# Patient Record
Sex: Male | Born: 1965 | Race: White | Hispanic: No | Marital: Single | Smoking: Never smoker
Health system: Southern US, Community
[De-identification: ages and names within clinical notes are randomized; demographics above are authoritative.]

## PROBLEM LIST (undated history)

## (undated) DIAGNOSIS — T7840XA Allergy, unspecified, initial encounter: Secondary | ICD-10-CM

## (undated) DIAGNOSIS — F329 Major depressive disorder, single episode, unspecified: Secondary | ICD-10-CM

## (undated) DIAGNOSIS — F419 Anxiety disorder, unspecified: Secondary | ICD-10-CM

## (undated) DIAGNOSIS — E785 Hyperlipidemia, unspecified: Secondary | ICD-10-CM

## (undated) DIAGNOSIS — F32A Depression, unspecified: Secondary | ICD-10-CM

## (undated) HISTORY — DX: Hyperlipidemia, unspecified: E78.5

## (undated) HISTORY — DX: Depression, unspecified: F32.A

## (undated) HISTORY — PX: COLONOSCOPY: SHX174

## (undated) HISTORY — DX: Major depressive disorder, single episode, unspecified: F32.9

## (undated) HISTORY — PX: SKIN BIOPSY: SHX1

## (undated) HISTORY — DX: Anxiety disorder, unspecified: F41.9

## (undated) HISTORY — DX: Allergy, unspecified, initial encounter: T78.40XA

---

## 1984-10-23 HISTORY — PX: DERMABRASION: SHX610

## 2004-10-27 ENCOUNTER — Ambulatory Visit: Payer: Self-pay | Admitting: Family Medicine

## 2004-12-14 ENCOUNTER — Ambulatory Visit: Payer: Self-pay | Admitting: Family Medicine

## 2005-02-21 ENCOUNTER — Ambulatory Visit: Payer: Self-pay | Admitting: Family Medicine

## 2005-09-18 ENCOUNTER — Ambulatory Visit: Payer: Self-pay | Admitting: Family Medicine

## 2005-10-10 ENCOUNTER — Ambulatory Visit: Payer: Self-pay | Admitting: Family Medicine

## 2011-03-30 ENCOUNTER — Ambulatory Visit (INDEPENDENT_AMBULATORY_CARE_PROVIDER_SITE_OTHER): Payer: BC Managed Care – PPO | Admitting: Family Medicine

## 2011-03-30 ENCOUNTER — Encounter: Payer: Self-pay | Admitting: Family Medicine

## 2011-03-30 VITALS — BP 108/80 | HR 64 | Ht 68.0 in | Wt 171.0 lb

## 2011-03-30 DIAGNOSIS — R5381 Other malaise: Secondary | ICD-10-CM

## 2011-03-30 DIAGNOSIS — R5383 Other fatigue: Secondary | ICD-10-CM

## 2011-03-30 DIAGNOSIS — F419 Anxiety disorder, unspecified: Secondary | ICD-10-CM

## 2011-03-30 DIAGNOSIS — F32A Depression, unspecified: Secondary | ICD-10-CM | POA: Insufficient documentation

## 2011-03-30 DIAGNOSIS — F329 Major depressive disorder, single episode, unspecified: Secondary | ICD-10-CM | POA: Insufficient documentation

## 2011-03-30 DIAGNOSIS — N400 Enlarged prostate without lower urinary tract symptoms: Secondary | ICD-10-CM

## 2011-03-30 DIAGNOSIS — F411 Generalized anxiety disorder: Secondary | ICD-10-CM

## 2011-03-30 DIAGNOSIS — Z8249 Family history of ischemic heart disease and other diseases of the circulatory system: Secondary | ICD-10-CM

## 2011-03-30 DIAGNOSIS — Z0000 Encounter for general adult medical examination without abnormal findings: Secondary | ICD-10-CM

## 2011-03-30 LAB — COMPREHENSIVE METABOLIC PANEL
ALT: 16 U/L (ref 0–53)
AST: 18 U/L (ref 0–37)
Albumin: 4.8 g/dL (ref 3.5–5.2)
Alkaline Phosphatase: 69 U/L (ref 39–117)
Glucose, Bld: 89 mg/dL (ref 70–99)
Potassium: 4.2 mEq/L (ref 3.5–5.3)
Sodium: 141 mEq/L (ref 135–145)
Total Protein: 7.4 g/dL (ref 6.0–8.3)

## 2011-03-30 LAB — CBC WITH DIFFERENTIAL/PLATELET
Basophils Relative: 0 % (ref 0–1)
Hemoglobin: 15 g/dL (ref 13.0–17.0)
MCHC: 34.4 g/dL (ref 30.0–36.0)
Monocytes Relative: 6 % (ref 3–12)
Neutro Abs: 4.3 10*3/uL (ref 1.7–7.7)
Neutrophils Relative %: 66 % (ref 43–77)
Platelets: 224 10*3/uL (ref 150–400)
RBC: 5.21 MIL/uL (ref 4.22–5.81)

## 2011-03-30 LAB — POCT URINALYSIS DIPSTICK
Bilirubin, UA: NEGATIVE
Glucose, UA: NEGATIVE
Leukocytes, UA: NEGATIVE
Nitrite, UA: NEGATIVE
pH, UA: 6

## 2011-03-30 LAB — LIPID PANEL
LDL Cholesterol: 117 mg/dL — ABNORMAL HIGH (ref 0–99)
Triglycerides: 292 mg/dL — ABNORMAL HIGH (ref <150)
VLDL: 58 mg/dL — ABNORMAL HIGH (ref 0–40)

## 2011-03-30 LAB — TESTOSTERONE: Testosterone: 659.77 ng/dL (ref 250–890)

## 2011-03-30 LAB — TSH: TSH: 0.929 u[IU]/mL (ref 0.350–4.500)

## 2011-03-30 MED ORDER — FINASTERIDE 5 MG PO TABS: 5.0000 mg | ORAL_TABLET | Freq: Every day | ORAL | Status: DC

## 2011-03-30 MED ORDER — DUTASTERIDE 0.5 MG PO CAPS: 0.5000 mg | ORAL_CAPSULE | Freq: Every day | ORAL | Status: AC

## 2011-03-30 MED ORDER — MIRTAZAPINE 30 MG PO TABS: 30.0000 mg | ORAL_TABLET | Freq: Two times a day (BID) | ORAL | Status: DC

## 2011-03-30 MED ORDER — TAMSULOSIN HCL 0.4 MG PO CAPS: 0.4000 mg | ORAL_CAPSULE | Freq: Two times a day (BID) | ORAL | Status: DC

## 2011-03-30 NOTE — Progress Notes (Signed)
Subjective:    Patient ID: Daniel Cherry, male    DOB: Aug 21, 1966, 45 y.o.   MRN: [id number]  HPI he is here for a complete examination. He has a previous history of BPH and is on multiple medications for this. He had been seeing a physician in the [place] area. He continues to have difficulty with urinary urgency, frequency, decreased stream he also has a history of anxiety and presently is on Remeron. He was placed on this by Dr. Phillip Heal and he will followup with her. He does complain of difficulty the last 3 months with fatigue as well as decreased libido. No skin or hair changes. No history of depression. His social and family history were reviewed and does show a history of heart disease with his sister. He is in a monogamous 7 year relationship. His work is going well. He exercises regularly. He does wear his seatbelt.    Review of Systems  Constitutional: Positive for fatigue.  HENT: Negative.   Eyes: Negative.   Respiratory: Negative.   Cardiovascular: Negative.   Gastrointestinal: Negative.   Genitourinary: Positive for urgency, frequency and difficulty urinating.  Musculoskeletal: Negative.   Skin: Negative.   Neurological: Negative.   Hematological: Negative.   Psychiatric/Behavioral: Negative.        Objective:   Physical ExamBP 108/80  Pulse 64  Ht 5\' 8"  (1.727 m)  Wt 171 lb (77.565 kg)  BMI 26.00 kg/m2  General Appearance:    Alert, cooperative, no distress, appears stated age  Head:    Normocephalic, without obvious abnormality, atraumatic  Eyes:    PERRL, conjunctiva/corneas clear, EOM's intact, fundi    benign  Ears:    Normal TM's and external ear canals  Nose:   Nares normal, mucosa normal, no drainage or sinus   tenderness  Throat:   Lips, mucosa, and tongue normal; teeth and gums normal  Neck:   Supple, no lymphadenopathy;  thyroid:  no   enlargement/tenderness/nodules; no carotid   bruit or JVD  Back:    Spine nontender, no curvature, ROM normal, no  CVA     tenderness  Lungs:     Clear to auscultation bilaterally without wheezes, rales or     ronchi; respirations unlabored  Chest Wall:    No tenderness or deformity   Heart:    Regular rate and rhythm, S1 and S2 normal, no murmur, rub   or gallop  Breast Exam:    No chest wall tenderness, masses or gynecomastia  Abdomen:     Soft, non-tender, nondistended, normoactive bowel sounds,    no masses, no hepatosplenomegaly  Genitalia:    Normal male external genitalia without lesions.  Testicles without masses.  No inguinal hernias.      Extremities:   No clubbing, cyanosis or edema  Pulses:   2+ and symmetric all extremities  Skin:   Skin color, texture, turgor normal, no rashes or lesions.skin on the face does have scarring from previous acne surgery   Lymph nodes:   Cervical, supraclavicular, and axillary nodes normal  Neurologic:   CNII-XII intact, normal strength, sensation and gait; reflexes 2+ and symmetric throughout          Psych:   Normal mood, affect, hygiene and grooming.    EKG shows no acute changes        Assessment & Plan:  BPH. Anxiety. Fatigue. I will refer back to urology. He will followup with his psychiatrist concerning his anxiety. Routine blood work including thyroid  and testosterone was done as well as PSA.

## 2011-03-30 NOTE — Patient Instructions (Signed)
I will refer you to urology. Followup with her psychiatrist. We will call you with results of the blood work.

## 2011-04-28 ENCOUNTER — Telehealth: Payer: Self-pay | Admitting: Family Medicine

## 2011-04-28 MED ORDER — FINASTERIDE 5 MG PO TABS: 5.0000 mg | ORAL_TABLET | Freq: Every day | ORAL | Status: DC

## 2011-04-28 NOTE — Telephone Encounter (Signed)
LOOKS LIKE DR.LALONDE HAS TAKEN CARE OF IT

## 2011-04-28 NOTE — Telephone Encounter (Signed)
Is this ok?

## 2011-05-19 ENCOUNTER — Encounter: Payer: Self-pay | Admitting: Family Medicine

## 2011-05-19 ENCOUNTER — Ambulatory Visit (INDEPENDENT_AMBULATORY_CARE_PROVIDER_SITE_OTHER): Payer: BC Managed Care – PPO | Admitting: Family Medicine

## 2011-05-19 VITALS — BP 110/80 | HR 88 | Temp 98.1°F | Ht 68.0 in | Wt 173.0 lb

## 2011-05-19 DIAGNOSIS — J111 Influenza due to unidentified influenza virus with other respiratory manifestations: Secondary | ICD-10-CM

## 2011-05-19 MED ORDER — AMOXICILLIN 875 MG PO TABS: 875.0000 mg | ORAL_TABLET | Freq: Two times a day (BID) | ORAL | Status: AC

## 2011-05-19 NOTE — Progress Notes (Signed)
  Subjective:    Patient ID: Daniel Cherry, male    DOB: [birth year], 45 y.o.   MRN: [id number]  HPI Approximately one week ago he had the onset of chest congestion, headache, myalgias. No sore throat or earache. Yesterday he developed fever as well as productive cough cough, sinus pressure and ear congestion as well as fatigue and malaise. He does not smoke.   Review of Systems     Objective:   Physical Exam alert and in no distress. Tympanic membranes and canals are normal. Throat is clear. Tonsils are normal. Neck is supple without adenopathy or thyromegaly. Cardiac exam shows a regular sinus rhythm without murmurs or gallops. Lungs are clear to auscultation.        Assessment & Plan:  Bronchitis I will treat with Amoxil

## 2011-05-19 NOTE — Patient Instructions (Signed)
Call the antibiotic and if not totally back to normal call me

## 2011-07-17 ENCOUNTER — Telehealth: Payer: Self-pay | Admitting: Family Medicine

## 2011-07-18 ENCOUNTER — Other Ambulatory Visit: Payer: Self-pay

## 2011-07-18 MED ORDER — TAMSULOSIN HCL 0.4 MG PO CAPS: 0.4000 mg | ORAL_CAPSULE | Freq: Two times a day (BID) | ORAL | Status: DC

## 2011-07-18 NOTE — Telephone Encounter (Signed)
Consent [facility] sent to [facility]

## 2011-07-18 NOTE — Telephone Encounter (Signed)
Med was sent in

## 2011-07-18 NOTE — Telephone Encounter (Signed)
Is this ok?

## 2012-04-10 ENCOUNTER — Other Ambulatory Visit: Payer: Self-pay | Admitting: Family Medicine

## 2012-04-19 ENCOUNTER — Encounter: Payer: BC Managed Care – PPO | Admitting: Family Medicine

## 2012-07-11 ENCOUNTER — Encounter: Payer: Self-pay | Admitting: Family Medicine

## 2012-07-11 ENCOUNTER — Other Ambulatory Visit: Payer: Self-pay

## 2012-07-11 ENCOUNTER — Ambulatory Visit (INDEPENDENT_AMBULATORY_CARE_PROVIDER_SITE_OTHER): Payer: BC Managed Care – PPO | Admitting: Family Medicine

## 2012-07-11 VITALS — BP 110/70 | HR 80

## 2012-07-11 DIAGNOSIS — F329 Major depressive disorder, single episode, unspecified: Secondary | ICD-10-CM

## 2012-07-11 DIAGNOSIS — Z23 Encounter for immunization: Secondary | ICD-10-CM

## 2012-07-11 DIAGNOSIS — Z0000 Encounter for general adult medical examination without abnormal findings: Secondary | ICD-10-CM

## 2012-07-11 DIAGNOSIS — L255 Unspecified contact dermatitis due to plants, except food: Secondary | ICD-10-CM

## 2012-07-11 DIAGNOSIS — F32A Depression, unspecified: Secondary | ICD-10-CM

## 2012-07-11 DIAGNOSIS — F3289 Other specified depressive episodes: Secondary | ICD-10-CM

## 2012-07-11 LAB — LIPID PANEL
Cholesterol: 208 mg/dL — ABNORMAL HIGH (ref 0–200)
LDL Cholesterol: 113 mg/dL — ABNORMAL HIGH (ref 0–99)
Triglycerides: 295 mg/dL — ABNORMAL HIGH (ref <150)

## 2012-07-11 LAB — POCT URINALYSIS DIPSTICK
Bilirubin, UA: NEGATIVE
Glucose, UA: NEGATIVE
Ketones, UA: NEGATIVE
Leukocytes, UA: NEGATIVE
Nitrite, UA: NEGATIVE
pH, UA: 7

## 2012-07-11 LAB — COMPREHENSIVE METABOLIC PANEL
Albumin: 5 g/dL (ref 3.5–5.2)
Alkaline Phosphatase: 67 U/L (ref 39–117)
BUN: 16 mg/dL (ref 6–23)
CO2: 26 mEq/L (ref 19–32)
Calcium: 9.8 mg/dL (ref 8.4–10.5)
Chloride: 104 mEq/L (ref 96–112)
Glucose, Bld: 92 mg/dL (ref 70–99)
Potassium: 4.2 mEq/L (ref 3.5–5.3)
Sodium: 139 mEq/L (ref 135–145)
Total Protein: 7.4 g/dL (ref 6.0–8.3)

## 2012-07-11 LAB — CBC WITH DIFFERENTIAL/PLATELET
HCT: 42 % (ref 39.0–52.0)
Hemoglobin: 14.8 g/dL (ref 13.0–17.0)
Lymphocytes Relative: 22 % (ref 12–46)
Lymphs Abs: 1.7 10*3/uL (ref 0.7–4.0)
Monocytes Absolute: 0.5 10*3/uL (ref 0.1–1.0)
Monocytes Relative: 6 % (ref 3–12)
Neutro Abs: 5.3 10*3/uL (ref 1.7–7.7)
Neutrophils Relative %: 69 % (ref 43–77)
RBC: 5.16 MIL/uL (ref 4.22–5.81)

## 2012-07-11 LAB — HEMOCCULT GUIAC POC 1CARD (OFFICE)

## 2012-07-11 MED ORDER — FINASTERIDE 5 MG PO TABS: 5.0000 mg | ORAL_TABLET | Freq: Every day | ORAL | Status: DC

## 2012-07-11 MED ORDER — MIRTAZAPINE 30 MG PO TABS: 30.0000 mg | ORAL_TABLET | Freq: Two times a day (BID) | ORAL | Status: DC

## 2012-07-11 MED ORDER — TAMSULOSIN HCL 0.4 MG PO CAPS: 0.4000 mg | ORAL_CAPSULE | Freq: Two times a day (BID) | ORAL | Status: DC

## 2012-07-11 NOTE — Telephone Encounter (Signed)
Sent med in for pt  

## 2012-07-11 NOTE — Progress Notes (Signed)
Subjective:    Patient ID: Daniel Cherry, male    DOB: [birth year], 46 y.o.   MRN: [id number]  HPI He is here for complete examination. He is on Remeron and Rozerem given to him by Dr. Madaline Guthrie and is doing well on this. He has a long history of anxiety and did have a recent bout of depression. He is also on Flomax given to him by a urologist in [place]. He apparently did have an extensive workup and is doing well on his present medication. When he does not take the medicine regularly, he does have a great deal of difficulty urinating. He also has had recent exposure to poison ivy and is slowly recovering. Lesions are mainly on his legs. Family history was reviewed. He exercises regularly, does wear his seatbelt. He is in an 8 year monogamous relationship and this is going quite well.  Review of Systems  Constitutional: Negative.   HENT: Negative.   Respiratory: Negative.   Cardiovascular: Negative.   Gastrointestinal: Negative.   Genitourinary: Negative.   Musculoskeletal: Negative.   Skin: Positive for rash.  Neurological: Negative.   Hematological: Negative.   Psychiatric/Behavioral: Negative.        Objective:   Physical Exam BP 110/70  Pulse 80  SpO2 99%  General Appearance:    Alert, cooperative, no distress, appears stated age  Head:    Normocephalic, without obvious abnormality, atraumatic  Eyes:    PERRL, conjunctiva/corneas clear, EOM's intact, fundi    benign  Ears:    Normal TM's and external ear canals  Nose:   Nares normal, mucosa normal, no drainage or sinus   tenderness  Throat:   Lips, mucosa, and tongue normal; teeth and gums normal  Neck:   Supple, no lymphadenopathy;  thyroid:  no   enlargement/tenderness/nodules; no carotid   bruit or JVD  Back:    Spine nontender, no curvature, ROM normal, no CVA     tenderness  Lungs:     Clear to auscultation bilaterally without wheezes, rales or     ronchi; respirations unlabored  Chest Wall:    No tenderness or deformity   Heart:    Regular rate and rhythm, S1 and S2 normal, no murmur, rub   or gallop  Breast Exam:    No chest wall tenderness, masses or gynecomastia  Abdomen:     Soft, non-tender, nondistended, normoactive bowel sounds,    no masses, no hepatosplenomegaly  Genitalia:    Normal male external genitalia without lesions.  Testicles without masses.  No inguinal hernias.  Rectal:    Normal sphincter tone, no masses or tenderness; guaiac negative stool.  Prostate smooth, no nodules, not enlarged.  Extremities:   No clubbing, cyanosis or edema  Pulses:   2+ and symmetric all extremities  Skin:   Skin color, texture, turgor normal, no rashes or lesions  Lymph nodes:   Cervical, supraclavicular, and axillary nodes normal  Neurologic:   CNII-XII intact, normal strength, sensation and gait; reflexes 2+ and symmetric throughout          Psych:   Normal mood, affect, hygiene and grooming.          Assessment & Plan:   1. Routine general medical examination at a health care facility  POCT Urinalysis Dipstick, CBC with Differential, Comprehensive metabolic panel, Lipid panel, Hemoccult - 1 Card (office)  2. Rhus dermatitis    3. Depression     I encouraged him to continue to take excellent care of  himself. Flu shot offered and he refused.

## 2012-07-11 NOTE — Patient Instructions (Signed)
Keep taking good care of yourself 

## 2012-07-12 ENCOUNTER — Other Ambulatory Visit: Payer: Self-pay | Admitting: Internal Medicine

## 2012-07-16 ENCOUNTER — Telehealth: Payer: Self-pay | Admitting: Family Medicine

## 2012-07-16 NOTE — Telephone Encounter (Signed)
Verify the dosing with him.He gets this from his psychiatrist

## 2012-07-16 NOTE — Telephone Encounter (Signed)
Called pt spoke with Daniel Cherry he states phsychiatrist has him on 30 mg 2 at bedtime.  [facility] and [facility] same.

## 2012-07-16 NOTE — Telephone Encounter (Signed)
[facility] called and wants to verify strength for Remeron 30 mg 1 by mouth two times daily.  He is called due to high dose.  Please advise [facility] [phone] ref [id number]

## 2012-09-23 ENCOUNTER — Encounter: Payer: Self-pay | Admitting: Family Medicine

## 2012-11-08 ENCOUNTER — Telehealth: Payer: Self-pay | Admitting: Family Medicine

## 2012-11-08 MED ORDER — AZITHROMYCIN 250 MG PO TABS: ORAL_TABLET | ORAL | Status: DC

## 2012-11-08 NOTE — Telephone Encounter (Signed)
A Z-Pak was called in for pertussis prevention.

## 2012-12-07 ENCOUNTER — Other Ambulatory Visit: Payer: Self-pay

## 2013-01-20 ENCOUNTER — Other Ambulatory Visit: Payer: Self-pay | Admitting: Family Medicine

## 2013-02-06 ENCOUNTER — Telehealth: Payer: Self-pay | Admitting: Internal Medicine

## 2013-02-06 MED ORDER — TAMSULOSIN HCL 0.4 MG PO CAPS: 0.4000 mg | ORAL_CAPSULE | Freq: Every day | ORAL | Status: DC

## 2013-02-06 MED ORDER — FINASTERIDE 5 MG PO TABS: 5.0000 mg | ORAL_TABLET | Freq: Every day | ORAL | Status: DC

## 2013-02-06 MED ORDER — TAMSULOSIN HCL 0.4 MG PO CAPS: ORAL_CAPSULE | ORAL | Status: DC

## 2013-02-06 NOTE — Telephone Encounter (Signed)
Pt is on vacation in [place] and left meds at home and wanted [facility] [address] and needs a 2 week supply of meds

## 2013-03-13 ENCOUNTER — Other Ambulatory Visit: Payer: Self-pay | Admitting: Family Medicine

## 2013-03-13 NOTE — Telephone Encounter (Signed)
IS THIS OK AND IS THE AMOUNT CORRECT

## 2013-06-16 ENCOUNTER — Encounter: Payer: Self-pay | Admitting: Neurology

## 2013-06-18 ENCOUNTER — Ambulatory Visit: Payer: BC Managed Care – PPO | Admitting: Neurology

## 2013-08-05 ENCOUNTER — Ambulatory Visit (INDEPENDENT_AMBULATORY_CARE_PROVIDER_SITE_OTHER): Payer: BC Managed Care – PPO | Admitting: Family Medicine

## 2013-08-05 ENCOUNTER — Encounter: Payer: Self-pay | Admitting: Family Medicine

## 2013-08-05 VITALS — BP 106/68 | HR 80 | Wt 167.0 lb

## 2013-08-05 DIAGNOSIS — F419 Anxiety disorder, unspecified: Secondary | ICD-10-CM

## 2013-08-05 DIAGNOSIS — N400 Enlarged prostate without lower urinary tract symptoms: Secondary | ICD-10-CM

## 2013-08-05 DIAGNOSIS — Z8249 Family history of ischemic heart disease and other diseases of the circulatory system: Secondary | ICD-10-CM

## 2013-08-05 DIAGNOSIS — F411 Generalized anxiety disorder: Secondary | ICD-10-CM

## 2013-08-05 LAB — LIPID PANEL
Cholesterol: 150 mg/dL (ref 0–200)
HDL: 38 mg/dL — ABNORMAL LOW (ref >39)
Total CHOL/HDL Ratio: 3.9 Ratio

## 2013-08-05 MED ORDER — FINASTERIDE 5 MG PO TABS: ORAL_TABLET | ORAL | Status: DC

## 2013-08-05 MED ORDER — TAMSULOSIN HCL 0.4 MG PO CAPS: ORAL_CAPSULE | ORAL | Status: DC

## 2013-08-05 NOTE — Progress Notes (Signed)
  Subjective:    Patient ID: Daniel Cherry, male    DOB: [birth year], 47 y.o.   MRN: [id number]  HPI He is here for medication check. He continues to be followed by Dr. Madaline Cherry for treatment of his underlying anxiety. He seems to be doing well and is on a new medication. He has been in counseling in the past. There is a family history of anxiety. He also is had difficulty with BPH and recently saw urology. He seems to be relatively stable on his present medication regimen. He is also being sent to physical therapy to help with exercises. His home life is fairly stable although his her and her partner are now living with him temporarily. He has no other concerns or complaints. Review of his history indicates positive family history for heart disease   Review of Systems     Objective:   Physical Exam Daniel Cherry and in no distress otherwise not examined       Assessment & Plan:  BPH (benign prostatic hyperplasia) - Plan: tamsulosin (FLOMAX) 0.4 MG CAPS capsule, finasteride (PROSCAR) 5 MG tablet  Family history of heart disease in male family member before age 59 - Plan: Lipid panel  Anxiety  flu shot offered however he refused. I will continue him on his present medication regimen.

## 2013-08-28 ENCOUNTER — Other Ambulatory Visit: Payer: Self-pay

## 2013-10-27 ENCOUNTER — Telehealth: Payer: Self-pay | Admitting: Family Medicine

## 2013-10-27 DIAGNOSIS — N400 Enlarged prostate without lower urinary tract symptoms: Secondary | ICD-10-CM

## 2013-10-27 MED ORDER — TAMSULOSIN HCL 0.4 MG PO CAPS: ORAL_CAPSULE | ORAL | Status: DC

## 2013-10-27 NOTE — Telephone Encounter (Signed)
Flomax called in.

## 2014-08-07 ENCOUNTER — Other Ambulatory Visit: Payer: Self-pay

## 2014-08-10 ENCOUNTER — Encounter: Payer: Self-pay | Admitting: Family Medicine

## 2014-08-10 ENCOUNTER — Ambulatory Visit (INDEPENDENT_AMBULATORY_CARE_PROVIDER_SITE_OTHER): Payer: BC Managed Care – PPO | Admitting: Family Medicine

## 2014-08-10 VITALS — BP 110/78 | HR 70 | Wt 178.0 lb

## 2014-08-10 DIAGNOSIS — N400 Enlarged prostate without lower urinary tract symptoms: Secondary | ICD-10-CM

## 2014-08-10 DIAGNOSIS — F329 Major depressive disorder, single episode, unspecified: Secondary | ICD-10-CM

## 2014-08-10 DIAGNOSIS — F419 Anxiety disorder, unspecified: Secondary | ICD-10-CM

## 2014-08-10 DIAGNOSIS — F32A Depression, unspecified: Secondary | ICD-10-CM

## 2014-08-10 DIAGNOSIS — E781 Pure hyperglyceridemia: Secondary | ICD-10-CM

## 2014-08-10 DIAGNOSIS — Z8249 Family history of ischemic heart disease and other diseases of the circulatory system: Secondary | ICD-10-CM

## 2014-08-10 DIAGNOSIS — E785 Hyperlipidemia, unspecified: Secondary | ICD-10-CM

## 2014-08-10 LAB — CBC WITH DIFFERENTIAL/PLATELET
Basophils Absolute: 0 10*3/uL (ref 0.0–0.1)
Basophils Relative: 0 % (ref 0–1)
EOS ABS: 0.2 10*3/uL (ref 0.0–0.7)
EOS PCT: 2 % (ref 0–5)
HCT: 40.2 % (ref 39.0–52.0)
HEMOGLOBIN: 14.4 g/dL (ref 13.0–17.0)
LYMPHS ABS: 1.9 10*3/uL (ref 0.7–4.0)
Lymphocytes Relative: 21 % (ref 12–46)
MCH: 29.1 pg (ref 26.0–34.0)
MCHC: 35.8 g/dL (ref 30.0–36.0)
MCV: 81.4 fL (ref 78.0–100.0)
MONOS PCT: 7 % (ref 3–12)
Monocytes Absolute: 0.6 10*3/uL (ref 0.1–1.0)
Neutro Abs: 6.3 10*3/uL (ref 1.7–7.7)
Neutrophils Relative %: 70 % (ref 43–77)
Platelets: 249 10*3/uL (ref 150–400)
RBC: 4.94 MIL/uL (ref 4.22–5.81)
RDW: 13.2 % (ref 11.5–15.5)
WBC: 9 10*3/uL (ref 4.0–10.5)

## 2014-08-10 LAB — COMPREHENSIVE METABOLIC PANEL
ALT: 18 U/L (ref 0–53)
AST: 17 U/L (ref 0–37)
Albumin: 4.7 g/dL (ref 3.5–5.2)
Alkaline Phosphatase: 71 U/L (ref 39–117)
BILIRUBIN TOTAL: 0.4 mg/dL (ref 0.2–1.2)
BUN: 12 mg/dL (ref 6–23)
CALCIUM: 9.5 mg/dL (ref 8.4–10.5)
CO2: 24 meq/L (ref 19–32)
CREATININE: 0.86 mg/dL (ref 0.50–1.35)
Chloride: 104 mEq/L (ref 96–112)
Glucose, Bld: 80 mg/dL (ref 70–99)
Potassium: 4.2 mEq/L (ref 3.5–5.3)
Sodium: 139 mEq/L (ref 135–145)
Total Protein: 6.9 g/dL (ref 6.0–8.3)

## 2014-08-10 LAB — LIPID PANEL
Cholesterol: 225 mg/dL — ABNORMAL HIGH (ref 0–200)
HDL: 35 mg/dL — ABNORMAL LOW (ref >39)
TRIGLYCERIDES: 641 mg/dL — AB (ref <150)
Total CHOL/HDL Ratio: 6.4 Ratio

## 2014-08-10 MED ORDER — FINASTERIDE 5 MG PO TABS: ORAL_TABLET | ORAL | Status: DC

## 2014-08-10 MED ORDER — TAMSULOSIN HCL 0.4 MG PO CAPS: ORAL_CAPSULE | ORAL | Status: DC

## 2014-08-10 NOTE — Progress Notes (Signed)
   Subjective:    Patient ID: Daniel Cherry, male    DOB: 04/08/1966, 48 y.o.   MRN: [id number]  HPI He is here for medication check. He continues to be followed by Dr. Albertine Patricia for his underlying anxiety and depression and at this point seems to be very stable. He has been on the present drug regimen for over a year. He does have a history of BPH with OAB. He has seen urology in the past for this. He is stable on the Flomax and Proscar. Does have a family history of heart disease however is not on any medications. Smoking and drinking were reviewed. His relationship is going quite well. No plans at this time to get married.   Review of Systems     Objective:   Physical Exam alert and in no distress. Tympanic membranes and canals are normal. Throat is clear. Tonsils are normal. Neck is supple without adenopathy or thyromegaly. Cardiac exam shows a regular sinus rhythm without murmurs or gallops. Lungs are clear to auscultation.        Assessment & Plan:  Family history of heart disease in male family member before age 48 - Plan: CBC with Differential, Comprehensive metabolic panel, Lipid panel  Anxiety  BPH (benign prostatic hyperplasia) - Plan: tamsulosin (FLOMAX) 0.4 MG CAPS capsule, finasteride (PROSCAR) 5 MG tablet, CBC with Differential, Comprehensive metabolic panel  Depression  I encouraged him to continue to take good care of himself.

## 2014-08-11 DIAGNOSIS — E782 Mixed hyperlipidemia: Secondary | ICD-10-CM | POA: Insufficient documentation

## 2014-08-11 DIAGNOSIS — E785 Hyperlipidemia, unspecified: Secondary | ICD-10-CM | POA: Insufficient documentation

## 2014-08-11 DIAGNOSIS — E781 Pure hyperglyceridemia: Secondary | ICD-10-CM | POA: Insufficient documentation

## 2014-08-11 MED ORDER — ATORVASTATIN CALCIUM 20 MG PO TABS: 20.0000 mg | ORAL_TABLET | Freq: Every day | ORAL | Status: DC

## 2014-08-11 NOTE — Addendum Note (Signed)
Addended by: Denita Lung on: 08/11/2014 10:25 AM   Modules accepted: Orders

## 2014-10-17 ENCOUNTER — Ambulatory Visit (INDEPENDENT_AMBULATORY_CARE_PROVIDER_SITE_OTHER): Payer: BC Managed Care – PPO

## 2014-10-17 ENCOUNTER — Ambulatory Visit (INDEPENDENT_AMBULATORY_CARE_PROVIDER_SITE_OTHER): Payer: BC Managed Care – PPO | Admitting: Emergency Medicine

## 2014-10-17 VITALS — BP 120/82 | HR 93 | Temp 98.0°F | Resp 16 | Ht 68.4 in | Wt 181.8 lb

## 2014-10-17 DIAGNOSIS — R509 Fever, unspecified: Secondary | ICD-10-CM

## 2014-10-17 DIAGNOSIS — R05 Cough: Secondary | ICD-10-CM

## 2014-10-17 DIAGNOSIS — R0981 Nasal congestion: Secondary | ICD-10-CM

## 2014-10-17 DIAGNOSIS — R059 Cough, unspecified: Secondary | ICD-10-CM

## 2014-10-17 DIAGNOSIS — J189 Pneumonia, unspecified organism: Secondary | ICD-10-CM

## 2014-10-17 LAB — POCT CBC
Granulocyte percent: 70.4 %G (ref 37–80)
HCT, POC: 44.2 % (ref 43.5–53.7)
HEMOGLOBIN: 14.6 g/dL (ref 14.1–18.1)
LYMPH, POC: 1.6 (ref 0.6–3.4)
MCH: 28.4 pg (ref 27–31.2)
MCHC: 33.1 g/dL (ref 31.8–35.4)
MCV: 85.8 fL (ref 80–97)
MID (cbc): 0.9 (ref 0–0.9)
MPV: 7.1 fL (ref 0–99.8)
POC Granulocyte: 5.9 (ref 2–6.9)
POC LYMPH PERCENT: 18.8 %L (ref 10–50)
POC MID %: 10.8 %M (ref 0–12)
Platelet Count, POC: 212 10*3/uL (ref 142–424)
RBC: 5.15 M/uL (ref 4.69–6.13)
RDW, POC: 12.9 %
WBC: 8.4 10*3/uL (ref 4.6–10.2)

## 2014-10-17 LAB — HIV ANTIBODY (ROUTINE TESTING W REFLEX): HIV 1&2 Ab, 4th Generation: NONREACTIVE

## 2014-10-17 MED ORDER — HYDROCODONE-HOMATROPINE 5-1.5 MG/5ML PO SYRP: 5.0000 mL | ORAL_SOLUTION | Freq: Three times a day (TID) | ORAL | Status: DC | PRN

## 2014-10-17 MED ORDER — BENZONATATE 100 MG PO CAPS
100.0000 mg | ORAL_CAPSULE | Freq: Three times a day (TID) | ORAL | Status: DC | PRN
Start: 2014-10-17 — End: 2015-07-19

## 2014-10-17 MED ORDER — DOXYCYCLINE HYCLATE 100 MG PO TABS: 100.0000 mg | ORAL_TABLET | Freq: Two times a day (BID) | ORAL | Status: DC

## 2014-10-17 NOTE — Patient Instructions (Signed)

## 2014-10-17 NOTE — Progress Notes (Addendum)
   Subjective:    Patient ID: Daniel Cherry, male    DOB: 01-22-1966, 48 y.o.   MRN: [id number] This chart was scribed for Arlyss Queen, MD by Zola Button, Medical Scribe. This patient was seen in room 4 and the patient's care was started at 12:49 PM.   HPI HPI Comments: Daniel Cherry is a 48 y.o. male who presents to the [facility] complaining of gradual onset URI symptoms that started about 11 days ago. The symptoms improved after about 7-8 days since onset of symptoms, but they have started to worsen again. Patient reports having chest congestion, sinus congestion, dry cough, ears popping, fever, and difficulty sleeping the past 2 nights due to symptoms. He measured his temperature this morning at 104 degrees F. He has tried Mucinex, Nyquil and Claritin D without relief. Patient denies wheezing. He also denies smoking. Patient lives with his sister and two kids; they have also been sick with a barking cough that is worse than the patient's cough.   Review of Systems  Constitutional: Positive for fever.  HENT: Positive for congestion and ear pain.   Respiratory: Positive for cough. Negative for wheezing.   Psychiatric/Behavioral: Positive for sleep disturbance.       Objective:   Physical Exam CONSTITUTIONAL: Well developed/well nourished HEAD: Normocephalic/atraumatic EYES: EOM/PERRL ENMT: Mucous membranes moist NECK: supple no meningeal signs SPINE: entire spine nontender CV: S1/S2 noted, no murmurs/rubs/gallops noted LUNGS: Lungs are clear to auscultation bilaterally, no apparent distress ABDOMEN: soft, nontender, no rebound or guarding GU: no cva tenderness NEURO: Pt is awake/alert, moves all extremitiesx4 EXTREMITIES: pulses normal, full ROM SKIN: warm, color normal PSYCH: no abnormalities of mood noted [facility] reading (PRIMARY) by  Dr. Everlene Farrier there is an infiltrate in the right base with a small amount of fluid with blunting of the right costophrenic angle no  consolidated area. Results for orders placed or performed in visit on 10/17/14  POCT CBC  Result Value Ref Range   WBC 8.4 4.6 - 10.2 K/uL   Lymph, poc 1.6 0.6 - 3.4   POC LYMPH PERCENT 18.8 10 - 50 %L   MID (cbc) 0.9 0 - 0.9   POC MID % 10.8 0 - 12 %M   POC Granulocyte 5.9 2 - 6.9   Granulocyte percent 70.4 37 - 80 %G   RBC 5.15 4.69 - 6.13 M/uL   Hemoglobin 14.6 14.1 - 18.1 g/dL   HCT, POC 44.2 43.5 - 53.7 %   MCV 85.8 80 - 97 fL   MCH, POC 28.4 27 - 31.2 pg   MCHC 33.1 31.8 - 35.4 g/dL   RDW, POC 12.9 %   Platelet Count, POC 212 142 - 424 K/uL   MPV 7.1 0 - 99.8 fL          Assessment & Plan:  Patient has a right lower lobe infiltrate with small amount of fluid at right costophrenic angle. Will treat with doxycycline , and Tessalon Pearls. Follow up chest x-ray in ten days. Hycodan at HS. I personally performed the services described in this documentation, which was scribed in my presence. The recorded information has been reviewed and is accurate.

## 2015-03-05 ENCOUNTER — Ambulatory Visit (INDEPENDENT_AMBULATORY_CARE_PROVIDER_SITE_OTHER): Payer: Managed Care, Other (non HMO) | Admitting: Medical

## 2015-03-05 VITALS — BP 118/70 | HR 82 | Temp 97.5°F | Wt 177.6 lb

## 2015-03-05 DIAGNOSIS — N419 Inflammatory disease of prostate, unspecified: Secondary | ICD-10-CM | POA: Diagnosis not present

## 2015-03-05 DIAGNOSIS — R350 Frequency of micturition: Secondary | ICD-10-CM

## 2015-03-05 DIAGNOSIS — B351 Tinea unguium: Secondary | ICD-10-CM

## 2015-03-05 DIAGNOSIS — N400 Enlarged prostate without lower urinary tract symptoms: Secondary | ICD-10-CM

## 2015-03-05 DIAGNOSIS — R300 Dysuria: Secondary | ICD-10-CM | POA: Diagnosis not present

## 2015-03-05 LAB — POCT URINALYSIS DIPSTICK
Bilirubin, UA: NEGATIVE
Blood, UA: NEGATIVE
GLUCOSE UA: NEGATIVE
Ketones, UA: NEGATIVE
LEUKOCYTES UA: NEGATIVE
NITRITE UA: NEGATIVE
PH UA: 6
PROTEIN UA: NEGATIVE
Spec Grav, UA: 1.015
UROBILINOGEN UA: NEGATIVE

## 2015-03-05 MED ORDER — EFINACONAZOLE 10 % EX SOLN: CUTANEOUS | Status: DC

## 2015-03-05 MED ORDER — TAMSULOSIN HCL 0.4 MG PO CAPS: ORAL_CAPSULE | ORAL | Status: DC

## 2015-03-05 MED ORDER — CIPROFLOXACIN HCL 500 MG PO TABS: 500.0000 mg | ORAL_TABLET | Freq: Two times a day (BID) | ORAL | Status: DC

## 2015-03-05 MED ORDER — FINASTERIDE 5 MG PO TABS: ORAL_TABLET | ORAL | Status: DC

## 2015-03-05 NOTE — Progress Notes (Signed)
Subjective: Here for possible urinary tract infection x 2 weeks, worse in the last week.  He notes urinary frequency, urgency, but no urinary pressure, burning or pain.   No blood in urine, no discharge, no redness, no rash.  No back pain, no fever.  No bowel changes, no rectal pressure.  He has had UTI in the past, but has had prostate infections in the past. Last prostate 2-3 years ago.  No prior hospitalization for same.  Water intake in general is very good.  Has 1 partner.     No concern for STD, declines STD testing  Needs refills on BPH medication given job and insurance changes.  Does fine on these  Has toenail fungus for months not resolving with OTC meds  No other aggravating or relieving factors. No other complaint.  Objective: BP 118/70 mmHg  Pulse 82  Temp(Src) 97.5 F (36.4 C) (Oral)  Wt 177 lb 9.6 oz (80.559 kg)  Gen: wd, wn, nad Skin: yellow somewhat thickened nails of great toenails and few other nails Back nontender Abdomen +bs, soft, nontender, no mass, no organomegaly GU: normal male, circumcised, nontender, no mass, no lymphadenopathy, no hernia Rectal declined  Assessment: Encounter Diagnoses  Name Primary?  . Dysuria Yes  . Urinary frequency   . Prostatitis, unspecified prostatitis type   . BPH (benign prostatic hyperplasia)   . Onychomycosis    Plan: Dysuria, urinary frequency, prostatitis - begin Cipro x 2wk then recheck, may need to go full month of therapy, hydrate well, NSAID OTC BPH - c/t current medications, f/u with Dr. Redmond School on this onychomycosis - begin trial of Jublia, discussed timeframe for treatment results.  F/u 2wk on prostatitis

## 2015-06-04 ENCOUNTER — Other Ambulatory Visit: Payer: Self-pay | Admitting: Medical

## 2015-06-07 NOTE — Telephone Encounter (Signed)
See if this medicine is working and let me know

## 2015-06-07 NOTE — Telephone Encounter (Signed)
Is this okay to refill? 

## 2015-06-08 NOTE — Telephone Encounter (Signed)
Pt said it appears to be working

## 2015-06-19 ENCOUNTER — Telehealth: Payer: Self-pay | Admitting: Medical

## 2015-06-22 NOTE — Telephone Encounter (Signed)
Shane completed questionnaire & form emailed

## 2015-06-23 NOTE — Telephone Encounter (Signed)
We did not get a culture.  So if they want to c/t this instead of oral Lamisil, then have them return for toenail culture or other test.

## 2015-06-23 NOTE — Telephone Encounter (Signed)
P.A. Denied due to pt needs to have a diagnosis confirmed by one of the following tests: positive potassium hydroxide (KOH) preparation, culture or histology.

## 2015-06-24 NOTE — Telephone Encounter (Signed)
The other option is a trial of oral Lamisil.  Does he want to do this?

## 2015-06-24 NOTE — Telephone Encounter (Signed)
No, he called back and is going to come in for nail culture next Thursday.

## 2015-06-24 NOTE — Telephone Encounter (Signed)
Advised patient. He has appointment the end of the month and he has a newer job and can not get off anymore.

## 2015-06-24 NOTE — Telephone Encounter (Signed)
LM to CB

## 2015-07-01 ENCOUNTER — Encounter: Payer: Self-pay | Admitting: Medical

## 2015-07-01 ENCOUNTER — Ambulatory Visit (INDEPENDENT_AMBULATORY_CARE_PROVIDER_SITE_OTHER): Payer: PRIVATE HEALTH INSURANCE | Admitting: Medical

## 2015-07-01 VITALS — BP 120/70 | HR 68 | Temp 97.9°F | Resp 16 | Wt 180.0 lb

## 2015-07-01 DIAGNOSIS — B351 Tinea unguium: Secondary | ICD-10-CM

## 2015-07-01 NOTE — Progress Notes (Signed)
   Subjective Chief Complaint  Patient presents with  . nail culture    left toe nail fungus   Here for nail fungus.   I have seem him prior for the same.  Been having nail fungus for months.   Did trial of Jublia, but now insurance is requiring toenail culture which is why he is here.  Feels like he is seeing some improvement on Jublia.   Not ready to try oral Lamisil.   No other c/o.  ROS as in subjective  Objective: BP 120/70 mmHg  Pulse 68  Temp(Src) 97.9 F (36.6 C) (Oral)  Resp 16  Wt 180 lb (81.647 kg)  Gen: wd, wn, nad Skin/nails: yellow somewhat thickened nails of great toenails and few other nails   Assessment: Encounter Diagnosis  Name Primary?  Marland Kitchen Onychomycosis Yes    Plan: Toenail sample taken with sterile toenail clippers and sent for culture.   Will await culture and presumably will restart Jublia.  Clinically he has nail fungus.

## 2015-07-19 ENCOUNTER — Other Ambulatory Visit: Payer: Self-pay

## 2015-07-19 ENCOUNTER — Ambulatory Visit (INDEPENDENT_AMBULATORY_CARE_PROVIDER_SITE_OTHER): Payer: PRIVATE HEALTH INSURANCE | Admitting: Family Medicine

## 2015-07-19 VITALS — BP 100/60 | HR 84 | Wt 176.0 lb

## 2015-07-19 DIAGNOSIS — N419 Inflammatory disease of prostate, unspecified: Secondary | ICD-10-CM | POA: Diagnosis not present

## 2015-07-19 DIAGNOSIS — N400 Enlarged prostate without lower urinary tract symptoms: Secondary | ICD-10-CM

## 2015-07-19 DIAGNOSIS — N410 Acute prostatitis: Secondary | ICD-10-CM | POA: Diagnosis not present

## 2015-07-19 DIAGNOSIS — B079 Viral wart, unspecified: Secondary | ICD-10-CM | POA: Diagnosis not present

## 2015-07-19 DIAGNOSIS — Z79899 Other long term (current) drug therapy: Secondary | ICD-10-CM

## 2015-07-19 DIAGNOSIS — B351 Tinea unguium: Secondary | ICD-10-CM

## 2015-07-19 DIAGNOSIS — R350 Frequency of micturition: Secondary | ICD-10-CM

## 2015-07-19 LAB — CBC WITH DIFFERENTIAL/PLATELET
BASOS PCT: 0 % (ref 0–1)
Basophils Absolute: 0 10*3/uL (ref 0.0–0.1)
EOS ABS: 0.2 10*3/uL (ref 0.0–0.7)
Eosinophils Relative: 3 % (ref 0–5)
HCT: 42.3 % (ref 39.0–52.0)
HEMOGLOBIN: 14.4 g/dL (ref 13.0–17.0)
Lymphocytes Relative: 24 % (ref 12–46)
Lymphs Abs: 1.7 10*3/uL (ref 0.7–4.0)
MCH: 28.1 pg (ref 26.0–34.0)
MCHC: 34 g/dL (ref 30.0–36.0)
MCV: 82.6 fL (ref 78.0–100.0)
MONO ABS: 0.6 10*3/uL (ref 0.1–1.0)
MPV: 9.7 fL (ref 8.6–12.4)
Monocytes Relative: 8 % (ref 3–12)
NEUTROS ABS: 4.5 10*3/uL (ref 1.7–7.7)
NEUTROS PCT: 65 % (ref 43–77)
PLATELETS: 239 10*3/uL (ref 150–400)
RBC: 5.12 MIL/uL (ref 4.22–5.81)
RDW: 13.4 % (ref 11.5–15.5)
WBC: 6.9 10*3/uL (ref 4.0–10.5)

## 2015-07-19 LAB — COMPREHENSIVE METABOLIC PANEL
ALBUMIN: 4.5 g/dL (ref 3.6–5.1)
ALT: 18 U/L (ref 9–46)
AST: 19 U/L (ref 10–35)
Alkaline Phosphatase: 69 U/L (ref 40–115)
BILIRUBIN TOTAL: 0.5 mg/dL (ref 0.2–1.2)
BUN: 15 mg/dL (ref 7–25)
CO2: 22 mmol/L (ref 20–31)
CREATININE: 0.82 mg/dL (ref 0.70–1.33)
Calcium: 9.4 mg/dL (ref 8.6–10.3)
Chloride: 105 mmol/L (ref 98–110)
GLUCOSE: 104 mg/dL — AB (ref 65–99)
Potassium: 3.8 mmol/L (ref 3.5–5.3)
SODIUM: 137 mmol/L (ref 135–146)
Total Protein: 6.8 g/dL (ref 6.1–8.1)

## 2015-07-19 LAB — LIPID PANEL
Cholesterol: 207 mg/dL — ABNORMAL HIGH (ref 125–200)
HDL: 34 mg/dL — ABNORMAL LOW (ref >=40)
LDL CALC: 121 mg/dL (ref <130)
TRIGLYCERIDES: 261 mg/dL — AB (ref <150)
Total CHOL/HDL Ratio: 6.1 Ratio — ABNORMAL HIGH (ref <=5.0)
VLDL: 52 mg/dL — AB (ref <30)

## 2015-07-19 MED ORDER — TERBINAFINE HCL 250 MG PO TABS: 250.0000 mg | ORAL_TABLET | Freq: Every day | ORAL | Status: DC

## 2015-07-19 MED ORDER — TAMSULOSIN HCL 0.4 MG PO CAPS: ORAL_CAPSULE | ORAL | Status: DC

## 2015-07-19 MED ORDER — CIPROFLOXACIN HCL 500 MG PO TABS: 500.0000 mg | ORAL_TABLET | Freq: Two times a day (BID) | ORAL | Status: DC

## 2015-07-19 MED ORDER — FINASTERIDE 5 MG PO TABS
ORAL_TABLET | ORAL | Status: DC
Start: 2015-07-19 — End: 2016-07-18

## 2015-07-19 NOTE — Progress Notes (Signed)
   Subjective:    Patient ID: Daniel Cherry, male    DOB: [birth year], 49 y.o.   MRN: [id number]  HPI He is here for multiple issues. He does have underlying BPH and continues on medications given to him by urology. I have been following up with him on this. He has done fairly well until last 2 weeks when he has noted increased frequency, decreased stream but no fever, chills, abdominal pain. In the past when this has occurred he has had a prostate infection which required use of Cipro for at least a month. He also has a lesion on the top of his head that he would like evaluated. He also has a history of onychomycosis however his insurance will not pay for the topical preparation. He continues to be followed by his psychiatrist who wants routine blood work especially since he is on multiple cyclotropic medications. He also states he now has a new job and this is going quite well.   Review of Systems     Objective:   Physical Exam Alert and in no distress with appropriate affect. A 1 cm raised slightly pigmented lesion is noted in the mid parieto-occipital area. Rectal exam does show a very tender and boggy prostate. He does have evidence of thickening of the left great toenail.      Assessment & Plan:  Wart  Onychomycosis - Plan: terbinafine (LAMISIL) 250 MG tablet  Urinary frequency  Prostatitis, unspecified prostatitis type  BPH (benign prostatic hyperplasia) - Plan: tamsulosin (FLOMAX) 0.4 MG CAPS capsule, finasteride (PROSCAR) 5 MG tablet  Encounter for long-term (current) use of medications - Plan: CBC with Differential/Platelet, Comprehensive metabolic panel, Lipid panel  Acute prostatitis - Plan: ciprofloxacin (CIPRO) 500 MG tablet  recommend he return here at his convenience for excision of the wart. Discussed treatment of the onychomycosis and did recommend trying Lamisil. He is comfortable with this. A 90 day supply will be given. He is to call with his prostate if he continues  have difficulty. Flu shot offered however he refused.

## 2015-07-21 NOTE — Telephone Encounter (Signed)
Waiting culture results

## 2015-07-28 ENCOUNTER — Other Ambulatory Visit: Payer: Self-pay | Admitting: Medical

## 2015-07-28 LAB — CULTURE, FUNGUS WITHOUT SMEAR

## 2015-07-28 MED ORDER — CICLOPIROX 8 % EX SOLN: Freq: Every day | CUTANEOUS | Status: DC

## 2015-07-29 ENCOUNTER — Telehealth: Payer: Self-pay

## 2015-07-29 MED ORDER — CICLOPIROX 8 % EX SOLN: CUTANEOUS | Status: DC

## 2015-07-29 NOTE — Telephone Encounter (Signed)
Sent in rx.

## 2015-08-03 ENCOUNTER — Other Ambulatory Visit: Payer: Self-pay | Admitting: Medical

## 2015-08-10 NOTE — Telephone Encounter (Signed)
Pt switched to [place]

## 2015-08-16 ENCOUNTER — Telehealth: Payer: Self-pay | Admitting: Family Medicine

## 2015-08-16 DIAGNOSIS — N410 Acute prostatitis: Secondary | ICD-10-CM

## 2015-08-16 MED ORDER — CIPROFLOXACIN HCL 500 MG PO TABS: 500.0000 mg | ORAL_TABLET | Freq: Two times a day (BID) | ORAL | Status: DC

## 2015-08-16 NOTE — Telephone Encounter (Signed)
Patient called, he wants extension on Rx for prostate infection. States after a week got better but then got worse again but over last few days seeing improvement again. States he has had several of these in the past and feels like it is too soon to stop meds, it would just flare back up    [facility] [employer] [facility] [address]  Please advise patient if rx sent in or not

## 2015-08-16 NOTE — Telephone Encounter (Signed)
Let him know that the med was called in

## 2015-08-16 NOTE — Telephone Encounter (Signed)
Left message word for word  

## 2015-08-29 ENCOUNTER — Other Ambulatory Visit: Payer: Self-pay | Admitting: Medical

## 2015-08-30 NOTE — Telephone Encounter (Signed)
Is this ok to refill?  

## 2015-09-08 ENCOUNTER — Telehealth: Payer: Self-pay | Admitting: Family Medicine

## 2015-09-08 NOTE — Telephone Encounter (Signed)
Pt called and was requesting a refill on his terbinafine, looked and seen that he should have a 3 month supply, called the pharmacy, and they confirmed that had given him 68 tablets and that he had 22 tablets left that he could get,that the insurance just wouldn't pay for them all at one time, called pt and told him that the pharmacy would fill the 22 tablets and he would go get them, pt said he would call back if needed a appt to come back in to be seen for his nail fungus,

## 2015-10-12 ENCOUNTER — Ambulatory Visit (INDEPENDENT_AMBULATORY_CARE_PROVIDER_SITE_OTHER): Payer: PRIVATE HEALTH INSURANCE | Admitting: Family Medicine

## 2015-10-12 ENCOUNTER — Encounter: Payer: Self-pay | Admitting: Family Medicine

## 2015-10-12 VITALS — BP 130/78 | HR 80 | Resp 16 | Wt 185.2 lb

## 2015-10-12 DIAGNOSIS — Z23 Encounter for immunization: Secondary | ICD-10-CM

## 2015-10-12 DIAGNOSIS — Z79899 Other long term (current) drug therapy: Secondary | ICD-10-CM | POA: Diagnosis not present

## 2015-10-12 DIAGNOSIS — B079 Viral wart, unspecified: Secondary | ICD-10-CM

## 2015-10-12 DIAGNOSIS — B351 Tinea unguium: Secondary | ICD-10-CM | POA: Diagnosis not present

## 2015-10-12 LAB — COMPREHENSIVE METABOLIC PANEL
ALBUMIN: 4.8 g/dL (ref 3.6–5.1)
ALK PHOS: 75 U/L (ref 40–115)
ALT: 22 U/L (ref 9–46)
AST: 17 U/L (ref 10–35)
BILIRUBIN TOTAL: 0.3 mg/dL (ref 0.2–1.2)
BUN: 12 mg/dL (ref 7–25)
CHLORIDE: 104 mmol/L (ref 98–110)
CO2: 22 mmol/L (ref 20–31)
CREATININE: 0.87 mg/dL (ref 0.70–1.33)
Calcium: 9.3 mg/dL (ref 8.6–10.3)
Glucose, Bld: 94 mg/dL (ref 65–99)
Potassium: 3.9 mmol/L (ref 3.5–5.3)
SODIUM: 138 mmol/L (ref 135–146)
TOTAL PROTEIN: 7.4 g/dL (ref 6.1–8.1)

## 2015-10-12 MED ORDER — TERBINAFINE HCL 250 MG PO TABS: 250.0000 mg | ORAL_TABLET | Freq: Every day | ORAL | Status: DC

## 2015-10-12 NOTE — Progress Notes (Signed)
   Subjective:    Patient ID: Daniel Cherry, male    DOB: [birth year], 49 y.o.   MRN: [id number]  HPI He is here for a recheck. He does have onychomycosis and has been on Lamisil. He has had no difficulty from this. He also has 2 lesions on his upper anterior chest is well as 1 on his head that he would like evaluated.   Review of Systems     Objective:   Physical Exam A 2 one and half centimeter round raised confluently pigmented lesion is noted on the parieto-occipital area. He also has 2 less than 5 mm lesions on his anterior chest. One does appear to be a wartlike lesion, the other probably a benign mole Exam of his feet does show questionable resolution at the base of his toes.      Assessment & Plan:  Wart  Onychomycosis - Plan: Comprehensive metabolic panel, terbinafine (LAMISIL) 250 MG tablet  Encounter for long-term (current) use of medications - Plan: Comprehensive metabolic panel  Need for prophylactic vaccination and inoculation against influenza - Plan: Flu Vaccine QUAD 36+ mos IM  will continue on the Lamisil for the next several months. The lesions were injected without candidate epinephrine. Shave excisions were done on all 3 and the base is hyfrecated with silver nitrate. There was sent for pathology.

## 2015-11-02 ENCOUNTER — Telehealth: Payer: Self-pay

## 2015-11-02 NOTE — Telephone Encounter (Signed)
Left message for patient to see if he was going to get flu vaccine or declined.

## 2015-12-01 ENCOUNTER — Other Ambulatory Visit: Payer: Self-pay | Admitting: *Deleted

## 2015-12-01 NOTE — Telephone Encounter (Signed)
Called patient  [employer] results: No cancer Dr Redmond School

## 2015-12-07 ENCOUNTER — Telehealth: Payer: Self-pay | Admitting: *Deleted

## 2015-12-07 NOTE — Telephone Encounter (Signed)
[facility] Results No Cancer

## 2016-01-06 ENCOUNTER — Encounter: Payer: Self-pay | Admitting: Family Medicine

## 2016-01-09 ENCOUNTER — Other Ambulatory Visit: Payer: Self-pay | Admitting: Family Medicine

## 2016-01-09 ENCOUNTER — Telehealth: Payer: Self-pay | Admitting: Family Medicine

## 2016-01-09 MED ORDER — OSELTAMIVIR PHOSPHATE 75 MG PO CAPS: 75.0000 mg | ORAL_CAPSULE | Freq: Every day | ORAL | Status: DC

## 2016-01-09 NOTE — Telephone Encounter (Signed)
Family member diagnosed with influenza.  Requesting Tamiflu.  A/p: influenza exposure: tamiflu escribed.

## 2016-01-31 ENCOUNTER — Ambulatory Visit (INDEPENDENT_AMBULATORY_CARE_PROVIDER_SITE_OTHER): Payer: PRIVATE HEALTH INSURANCE | Admitting: Family Medicine

## 2016-01-31 ENCOUNTER — Encounter: Payer: Self-pay | Admitting: Family Medicine

## 2016-01-31 VITALS — BP 102/60 | HR 90 | Ht 68.5 in | Wt 177.8 lb

## 2016-01-31 DIAGNOSIS — D229 Melanocytic nevi, unspecified: Secondary | ICD-10-CM

## 2016-01-31 DIAGNOSIS — B351 Tinea unguium: Secondary | ICD-10-CM | POA: Diagnosis not present

## 2016-01-31 DIAGNOSIS — J209 Acute bronchitis, unspecified: Secondary | ICD-10-CM

## 2016-01-31 DIAGNOSIS — J309 Allergic rhinitis, unspecified: Secondary | ICD-10-CM

## 2016-01-31 MED ORDER — CLARITHROMYCIN 500 MG PO TABS: 500.0000 mg | ORAL_TABLET | Freq: Two times a day (BID) | ORAL | Status: DC

## 2016-01-31 MED ORDER — ITRACONAZOLE 100 MG PO CAPS: ORAL_CAPSULE | ORAL | Status: DC

## 2016-01-31 NOTE — Progress Notes (Signed)
   Subjective:    Patient ID: Daniel Cherry, male    DOB: [birth year], 50 y.o.   MRN: [id number]  HPI He is here for multiple issues. He had URI symptoms approximately 5 weeks ago over for the cough has continued. He has had no subsequent fever, chills, sore throat, earache. He does have underlying allergies with nasal congestion and rhinorrhea. He also has a history of onychomycosis. He did have a culture done which was negative. He then did try Lamisil but soft had no success with that. He would like to try different medication. He also has a lesion present on his left thigh.   Review of Systems     Objective:   Physical Exam Alert and in no distress. Tympanic membranes and canals are normal. Pharyngeal area is normal. Neck is supple without adenopathy or thyromegaly. Cardiac exam shows a regular sinus rhythm without murmurs or gallops. Lungs are clear to auscultation. Exam of the left thigh does show 1/2 cm slightly raised pigmented lesion with well demarcated borders and consistent color.       Assessment & Plan:  Onychomycosis - Plan: itraconazole (SPORANOX) 100 MG capsule  Acute bronchitis, unspecified organism - Plan: clarithromycin (BIAXIN) 500 MG tablet  Benign mole  Allergic rhinitis, unspecified allergic rhinitis type I discussed the treatment of the onychomycosis and the failure rate with various medications. I will switch him Sporanox butSpleen that this could be expensive. Also discussed treatment of the mole. Explained that this is benign and did not appear to be a wart. He will continue on his Flonase for his allergies since it works well. I will call and Biaxin as this could easily be an atypical bronchitis pattern. I again explained this to him.

## 2016-02-06 ENCOUNTER — Telehealth: Payer: Self-pay | Admitting: Family Medicine

## 2016-02-06 NOTE — Telephone Encounter (Signed)
P.A. ITACONAZOLE

## 2016-02-08 NOTE — Telephone Encounter (Signed)
Pt has appointment 4/24

## 2016-02-08 NOTE — Telephone Encounter (Signed)
Patient wants to know if he can have that done here ?

## 2016-02-08 NOTE — Telephone Encounter (Signed)
P.A. Isabelle Course will only be approved if condition is confirmed by Positive potassium (KOH) preparation, culture or histology. Do you want to switch?

## 2016-02-08 NOTE — Telephone Encounter (Signed)
Let patient know this. I think I discussed this with him.

## 2016-02-08 NOTE — Telephone Encounter (Signed)
He has schedule him to come in and we'll see if we can get a KOH positive on him

## 2016-02-14 ENCOUNTER — Encounter: Payer: Self-pay | Admitting: Family Medicine

## 2016-02-14 ENCOUNTER — Ambulatory Visit (INDEPENDENT_AMBULATORY_CARE_PROVIDER_SITE_OTHER): Payer: PRIVATE HEALTH INSURANCE | Admitting: Family Medicine

## 2016-02-14 VITALS — BP 116/70 | HR 80 | Ht 68.5 in | Wt 177.6 lb

## 2016-02-14 DIAGNOSIS — B351 Tinea unguium: Secondary | ICD-10-CM | POA: Diagnosis not present

## 2016-02-14 NOTE — Progress Notes (Signed)
   Subjective:    Patient ID: Daniel Cherry, male    DOB: 1966/03/26, 50 y.o.   MRN: [id number]  HPI He is here for a recheck. He was placed on Lamisil and December and did not clinically respond to treatment of his onychomycosis. He does note continued difficulty with toe especially when wearing shoes. He is forced to wear sandals as often as possible.   Review of Systems     Objective:   Physical Exam Thickening of the great toe and fourth toe is noted especially on the left. KOH was negative.       Assessment & Plan:  Onychomycosis - Plan: Culture, fungus without smear since he is having mechanical difficulty with the thickening of the nails, I will do a culture. I cautioned him that it could take up to 6 weeks to have the culture come back.

## 2016-02-23 ENCOUNTER — Telehealth: Payer: Self-pay | Admitting: Family Medicine

## 2016-02-23 NOTE — Telephone Encounter (Signed)
Resubmitted P.A. ITRACONAZOLE with positive fungus culture

## 2016-03-03 ENCOUNTER — Ambulatory Visit (INDEPENDENT_AMBULATORY_CARE_PROVIDER_SITE_OTHER): Payer: PRIVATE HEALTH INSURANCE | Admitting: Medical

## 2016-03-03 ENCOUNTER — Encounter: Payer: Self-pay | Admitting: Medical

## 2016-03-03 VITALS — BP 100/70 | HR 76 | Wt 173.0 lb

## 2016-03-03 DIAGNOSIS — N411 Chronic prostatitis: Secondary | ICD-10-CM | POA: Diagnosis not present

## 2016-03-03 LAB — POCT URINALYSIS DIPSTICK
Bilirubin, UA: NEGATIVE
Blood, UA: NEGATIVE
GLUCOSE UA: NEGATIVE
Ketones, UA: NEGATIVE
Leukocytes, UA: NEGATIVE
NITRITE UA: NEGATIVE
PROTEIN UA: NEGATIVE
Spec Grav, UA: 1.015
UROBILINOGEN UA: NEGATIVE
pH, UA: 6

## 2016-03-03 MED ORDER — CIPROFLOXACIN HCL 500 MG PO TABS: 500.0000 mg | ORAL_TABLET | Freq: Two times a day (BID) | ORAL | Status: DC

## 2016-03-03 NOTE — Progress Notes (Signed)
Subjective: Chief Complaint  Patient presents with  . Urinary Tract Infection    or prostate issues. frequency. never fully empties bladder. said it is not a burning but feels that his urethra gets hot.    Here for possible urinary or prostate infection.  Feels like he has to urinate constantly, at times doesn't seems to void completley, there is not burning, but feels hot in the urethra.  No blood in urine, no cloudiness to urine.  No penile discharge, no swelling or pain of testicles, no rectal pain.   This seems to be the similar symptoms as when he has gotten prostate infection in the past.  However, the hot sensation in the urethra is new.    Has had prostate infection last 4-5 months ago, gets 1-2 times yearly.   Has seen urology prior.   Had extensive tests in the past for recurrent prostate infection, but only major issue is underlying diagnosis of BPH.   No concern for STD.  No other aggravating or relieving factors. No other complaint.   Past Medical History  Diagnosis Date  . Anxiety   . Depression    ROS as in subjective   Objective: BP 100/70 mmHg  Pulse 76  Wt 173 lb (78.472 kg)  General appearance: alert, no distress, WD/WN Abdomen: +bs, soft, non tender, non distended, no masses, no hepatomegaly, no splenomegaly Back: nontender GU: normal male, circumcised, nontender, no lesions, no lymphadenopathy, no hernia, no swelling DRE: deferred    Assessment: Encounter Diagnosis  Name Primary?  . Chronic prostatitis Yes      Plan: Given his hx/o prostatitis and similar prior infection, will plan to use 3-4 wk of Cipro.  F/u in 3-4 wk

## 2016-03-13 LAB — CULTURE, FUNGUS WITHOUT SMEAR

## 2016-03-23 ENCOUNTER — Telehealth: Payer: Self-pay | Admitting: Medical

## 2016-03-23 NOTE — Telephone Encounter (Signed)
Called pharmacy & went thru for $10, but can only fill 30 days at a time but out of stock there, sent to [facility]..  Left message for pt

## 2016-03-23 NOTE — Telephone Encounter (Signed)
[facility] [place] called to report that there is a drug interaction between Tamsulosin and Sporanox

## 2016-03-23 NOTE — Telephone Encounter (Signed)
Called Appeal Dept t# [id number] and was finally approved til 06/23/16 effective immediately, will send fax.

## 2016-03-23 NOTE — Telephone Encounter (Signed)
Called pharmacy & drug interaction states it increases levels of Tamsulosin and may cause severe hypotension.  Per Dr. Redmond School, ok to fill and he will discuss with pt and his recommendations.

## 2016-03-23 NOTE — Telephone Encounter (Signed)
Pt also called and stated that he wanted to add that he is having pain when wearing shoes.

## 2016-03-24 ENCOUNTER — Encounter: Payer: Self-pay | Admitting: Family Medicine

## 2016-03-24 NOTE — Progress Notes (Signed)
   Subjective:    Patient ID: Daniel Cherry, male    DOB: 1965-11-10, 50 y.o.   MRN: [id number]  HPI    Review of Systems     Objective:   Physical Exam        Assessment & Plan:  There is a drug interaction between Sporanox and Flomax. I discussed the possible interaction with him in detail. Discussed possible low blood pressure issues. He will cut back to taking one Flomax per day and cautioned him to look for any problems with blood  pressure. He is comfortable with that.

## 2016-03-28 IMAGING — CR DG CHEST 2V
2 series · 2 of 2 positions shown · non-contrast
Comparison: None.

CLINICAL DATA: Shortness of breath.

EXAM:
CHEST  2 VIEW

[PA]
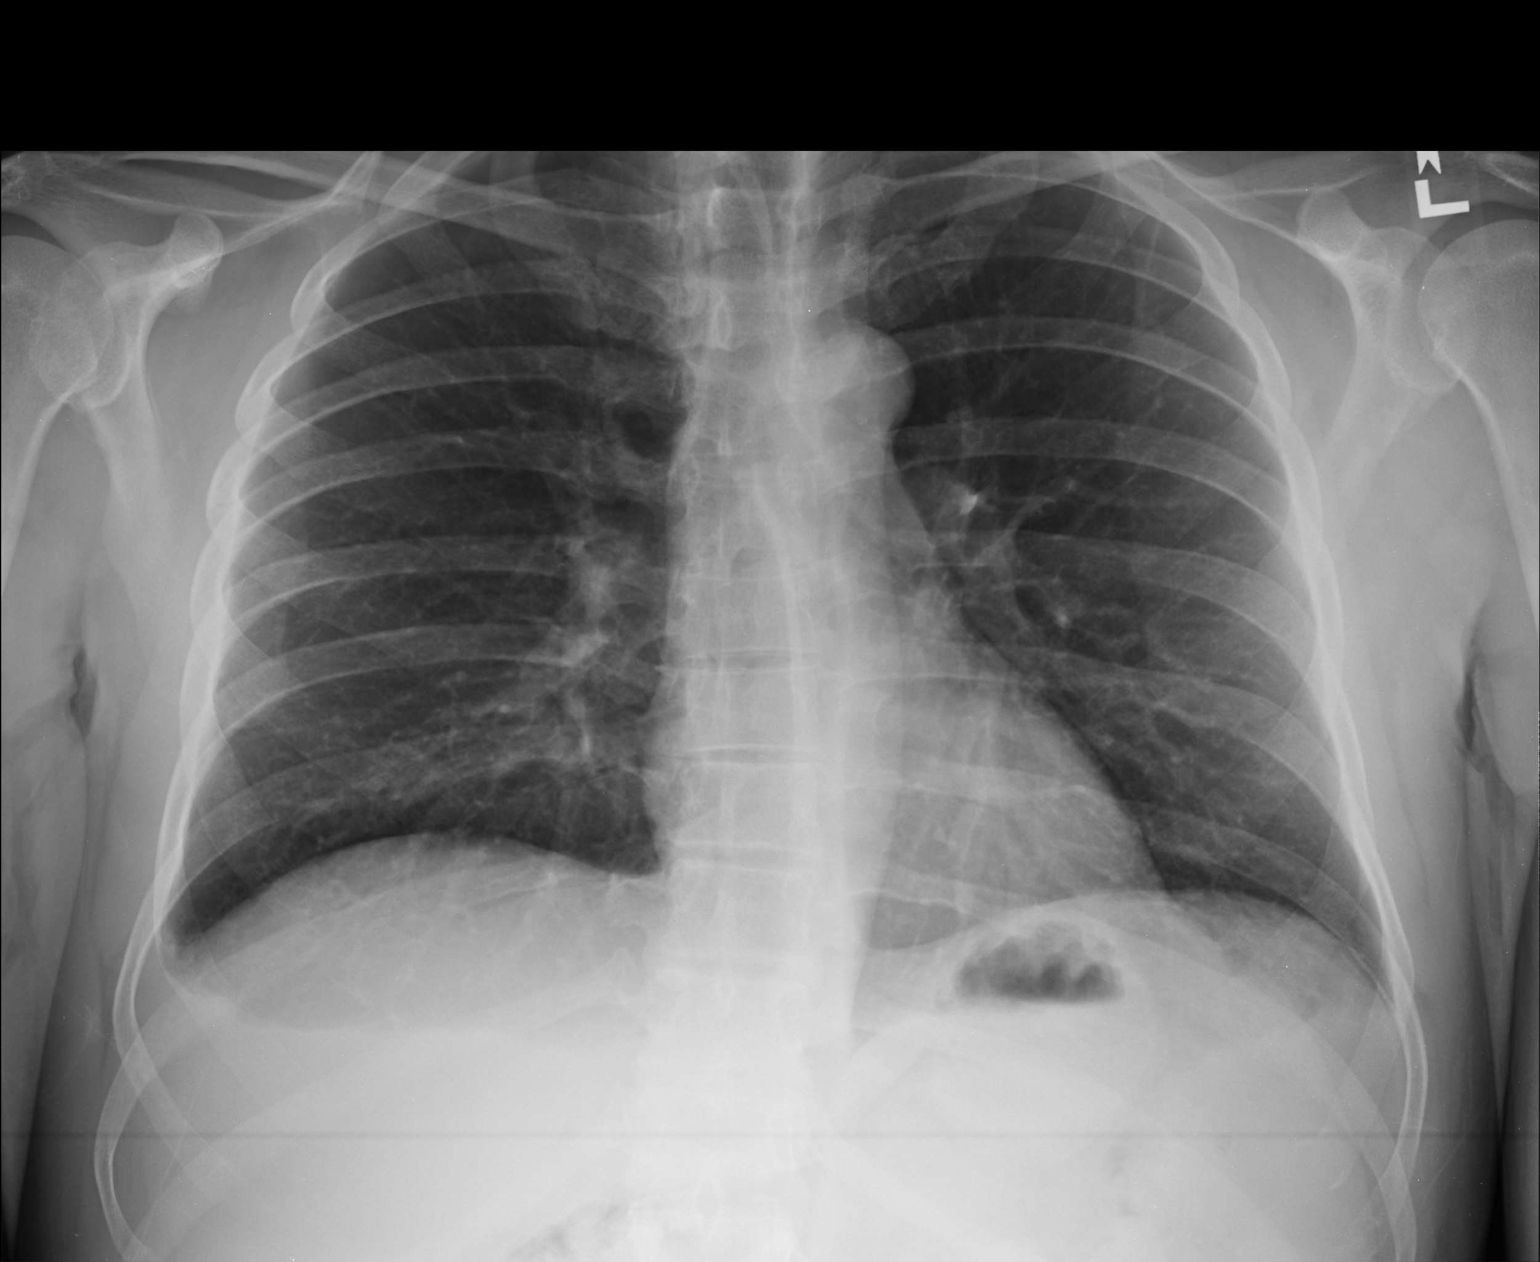

[lateral]
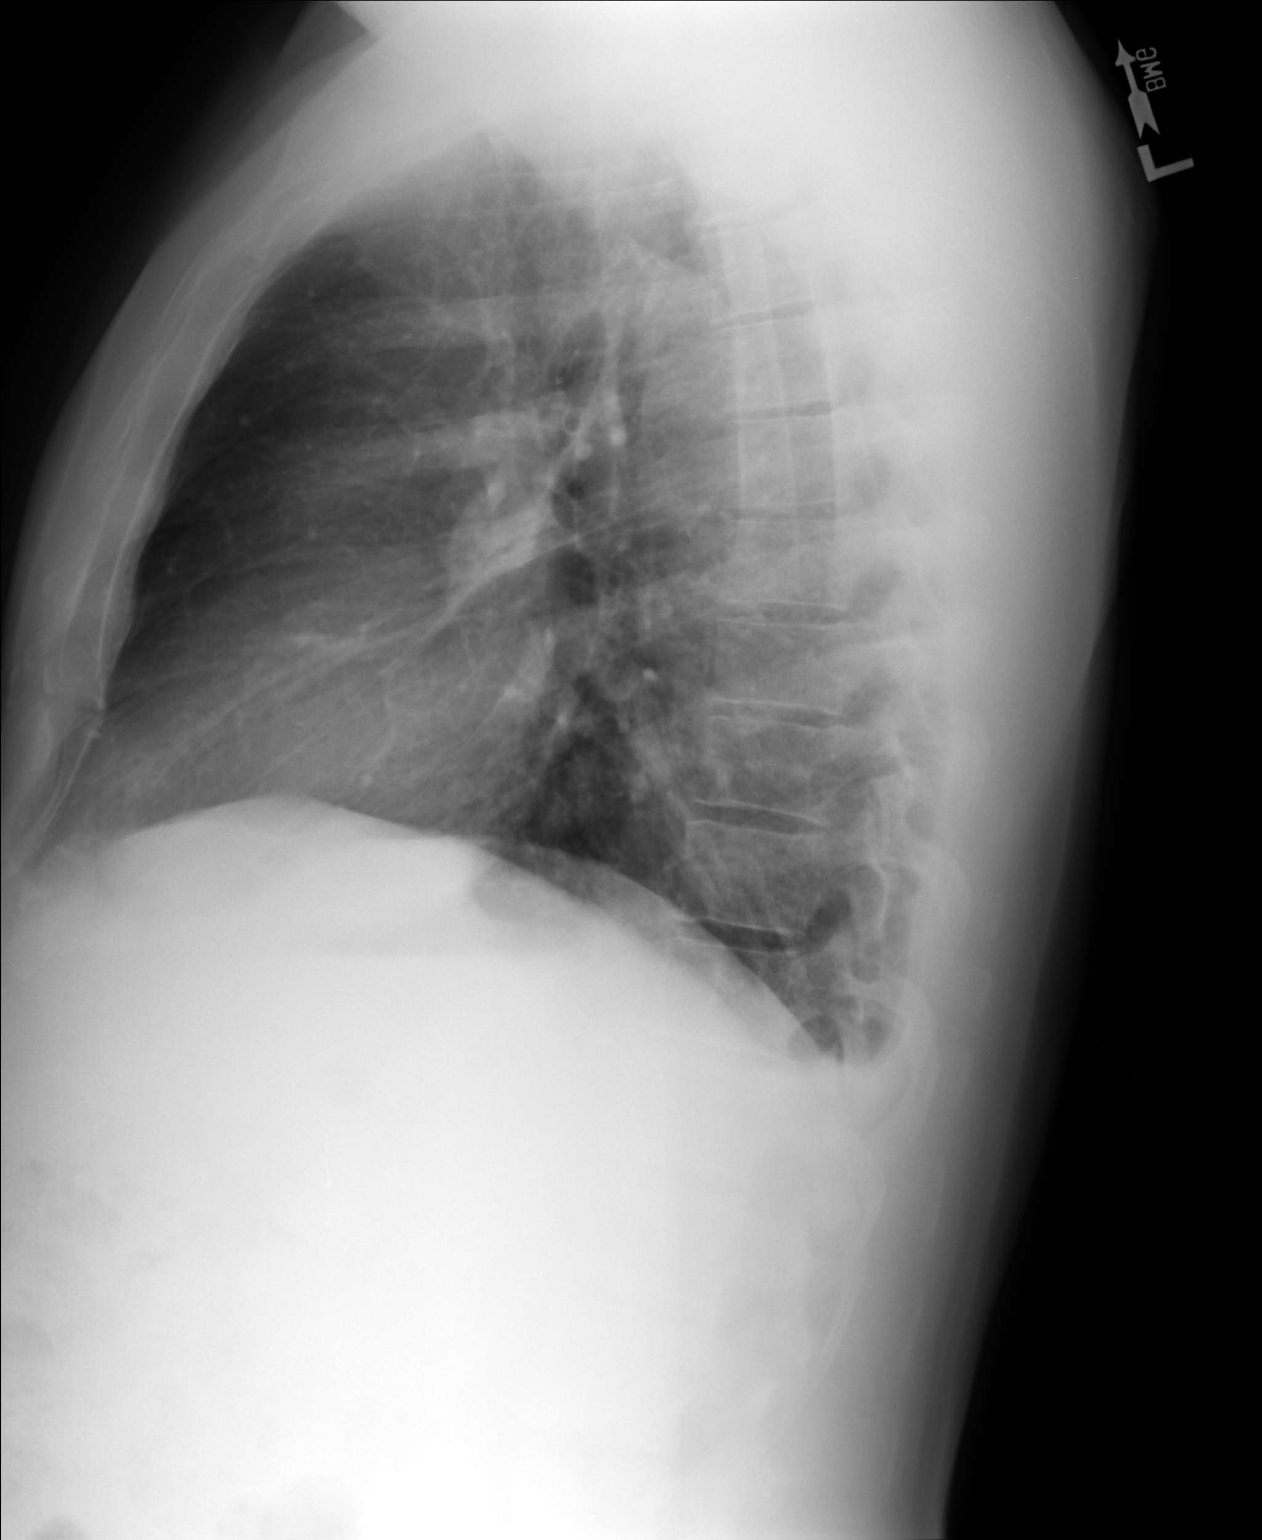

[2 of 2 positions shown; findings below may reference images not displayed]

FINDINGS: Lungs are adequately inflated with small amount of right pleural
fluid noted. No focal airspace consolidation. Cardiomediastinal
silhouette, bones and soft tissues are within normal.
IMPRESSION: Small amount of right pleural fluid.

## 2016-05-02 ENCOUNTER — Encounter: Payer: Self-pay | Admitting: Family Medicine

## 2016-05-02 ENCOUNTER — Ambulatory Visit (INDEPENDENT_AMBULATORY_CARE_PROVIDER_SITE_OTHER): Payer: PRIVATE HEALTH INSURANCE | Admitting: Family Medicine

## 2016-05-02 ENCOUNTER — Other Ambulatory Visit: Payer: Self-pay | Admitting: Family Medicine

## 2016-05-02 VITALS — BP 90/58 | HR 60 | Ht 69.0 in | Wt 163.8 lb

## 2016-05-02 DIAGNOSIS — Z79899 Other long term (current) drug therapy: Secondary | ICD-10-CM | POA: Diagnosis not present

## 2016-05-02 DIAGNOSIS — N411 Chronic prostatitis: Secondary | ICD-10-CM | POA: Diagnosis not present

## 2016-05-02 DIAGNOSIS — B351 Tinea unguium: Secondary | ICD-10-CM

## 2016-05-02 LAB — CBC WITH DIFFERENTIAL/PLATELET
BASOS PCT: 0 %
Basophils Absolute: 0 cells/uL (ref 0–200)
EOS ABS: 216 {cells}/uL (ref 15–500)
Eosinophils Relative: 4 %
HEMATOCRIT: 41.4 % (ref 38.5–50.0)
HEMOGLOBIN: 14.4 g/dL (ref 13.2–17.1)
LYMPHS ABS: 1404 {cells}/uL (ref 850–3900)
Lymphocytes Relative: 26 %
MCH: 29 pg (ref 27.0–33.0)
MCHC: 34.8 g/dL (ref 32.0–36.0)
MCV: 83.5 fL (ref 80.0–100.0)
MONO ABS: 324 {cells}/uL (ref 200–950)
MONOS PCT: 6 %
MPV: 10.5 fL (ref 7.5–12.5)
NEUTROS ABS: 3456 {cells}/uL (ref 1500–7800)
Neutrophils Relative %: 64 %
Platelets: 207 10*3/uL (ref 140–400)
RBC: 4.96 MIL/uL (ref 4.20–5.80)
RDW: 13.4 % (ref 11.0–15.0)
WBC: 5.4 10*3/uL (ref 4.0–10.5)

## 2016-05-02 LAB — COMPREHENSIVE METABOLIC PANEL
ALBUMIN: 4.4 g/dL (ref 3.6–5.1)
ALK PHOS: 70 U/L (ref 40–115)
ALT: 14 U/L (ref 9–46)
AST: 16 U/L (ref 10–35)
BILIRUBIN TOTAL: 0.4 mg/dL (ref 0.2–1.2)
BUN: 11 mg/dL (ref 7–25)
CALCIUM: 9 mg/dL (ref 8.6–10.3)
CO2: 22 mmol/L (ref 20–31)
Chloride: 106 mmol/L (ref 98–110)
Creat: 1.04 mg/dL (ref 0.70–1.33)
Glucose, Bld: 129 mg/dL — ABNORMAL HIGH (ref 65–99)
POTASSIUM: 3.7 mmol/L (ref 3.5–5.3)
Sodium: 138 mmol/L (ref 135–146)
Total Protein: 6.6 g/dL (ref 6.1–8.1)

## 2016-05-02 NOTE — Patient Instructions (Signed)
Try Emetrol

## 2016-05-02 NOTE — Progress Notes (Signed)
   Subjective:    Patient ID: Daniel Cherry, male    DOB: 1966/05/22, 50 y.o.   MRN: [id number]  HPI He is here for a recheck. He was recently seen for prostatitis and now states that his symptoms have gone completely away. He does have underlying urinary hesitancy and has stopped taking his tamsulosin and he is on Sporanox. He did have difficulty with the Sporanox initially causing trouble with fatigue and dizziness. He stopped for a while and started taking it again at 1 per day and is now on twice a day dosing. He now does complain of some slight nausea but is willing to put up with this. He has concerns over any liver involvement.   Review of Systems     Objective:   Physical Exam Alert and in no distress otherwise not examined       Assessment & Plan:  Chronic prostatitis - Plan: CBC with Differential/Platelet, Comprehensive metabolic panel  Onychomycosis - Plan: CBC with Differential/Platelet, Comprehensive metabolic panel  Encounter for long-term (current) use of medications - Plan: CBC with Differential/Platelet, Comprehensive metabolic panel Since he's not having anymore prostate related symptoms, no further antibiotic needed. He will continue to hold his tamsulosin while he is on Sporanox. I will check CBC and liver enzymes to be safe.

## 2016-05-03 LAB — HEMOGLOBIN A1C
Hgb A1c MFr Bld: 5.5 % (ref <5.7)
Mean Plasma Glucose: 111 mg/dL

## 2016-06-17 ENCOUNTER — Telehealth: Payer: Self-pay | Admitting: Family Medicine

## 2016-06-19 NOTE — Telephone Encounter (Signed)
P.A. Approved til 09/16/16, left message for pt, faxed pharmacy

## 2016-07-18 ENCOUNTER — Other Ambulatory Visit: Payer: Self-pay | Admitting: Family Medicine

## 2016-07-18 DIAGNOSIS — N400 Enlarged prostate without lower urinary tract symptoms: Secondary | ICD-10-CM

## 2016-07-18 NOTE — Telephone Encounter (Signed)
Is this okay to refill? 

## 2016-08-16 ENCOUNTER — Encounter: Payer: Self-pay | Admitting: Family Medicine

## 2016-08-16 ENCOUNTER — Ambulatory Visit (INDEPENDENT_AMBULATORY_CARE_PROVIDER_SITE_OTHER): Payer: PRIVATE HEALTH INSURANCE | Admitting: Family Medicine

## 2016-08-16 ENCOUNTER — Encounter: Payer: Self-pay | Admitting: Gastroenterology

## 2016-08-16 ENCOUNTER — Ambulatory Visit: Payer: PRIVATE HEALTH INSURANCE | Admitting: Family Medicine

## 2016-08-16 VITALS — BP 90/60 | HR 70 | Wt 166.0 lb

## 2016-08-16 DIAGNOSIS — N410 Acute prostatitis: Secondary | ICD-10-CM

## 2016-08-16 DIAGNOSIS — K644 Residual hemorrhoidal skin tags: Secondary | ICD-10-CM | POA: Diagnosis not present

## 2016-08-16 DIAGNOSIS — R351 Nocturia: Secondary | ICD-10-CM | POA: Diagnosis not present

## 2016-08-16 DIAGNOSIS — R34 Anuria and oliguria: Secondary | ICD-10-CM

## 2016-08-16 DIAGNOSIS — Z1211 Encounter for screening for malignant neoplasm of colon: Secondary | ICD-10-CM | POA: Diagnosis not present

## 2016-08-16 DIAGNOSIS — N401 Enlarged prostate with lower urinary tract symptoms: Secondary | ICD-10-CM

## 2016-08-16 LAB — POCT URINALYSIS DIPSTICK
BILIRUBIN UA: NEGATIVE
Clarity, UA: NEGATIVE
Glucose, UA: NEGATIVE
KETONES UA: NEGATIVE
LEUKOCYTES UA: NEGATIVE
Nitrite, UA: NEGATIVE
PH UA: 7
Protein, UA: NEGATIVE
RBC UA: NEGATIVE
Spec Grav, UA: 1.01
Urobilinogen, UA: NEGATIVE

## 2016-08-16 MED ORDER — CIPROFLOXACIN HCL 500 MG PO TABS: 500.0000 mg | ORAL_TABLET | Freq: Two times a day (BID) | ORAL | 0 refills | Status: DC

## 2016-08-16 NOTE — Progress Notes (Signed)
   Subjective:    Patient ID: Daniel Cherry, male    DOB: Aug 16, 1966, 50 y.o.   MRN: [id number]  HPI He has a long history of difficulty with urinary and prostate problems and has seen urology in the past. At one point he was on finasteride as well as Flomax but presently is only taking finasteride. He has a history of previous difficulty with prostatitis. He complains of nocturia, frequency, incomplete emptying but no fever, chills or abdominal pain.   Review of Systems     Objective:   Physical Exam Alert and in no distress. Anal exam does show skin tags present at the 3:00 position. Rectal exam does show a tender boggy prostate.       Assessment & Plan:  Urine output low - Plan: POCT Urinalysis Dipstick, ciprofloxacin (CIPRO) 500 MG tablet  Acute prostatitis - Plan: ciprofloxacin (CIPRO) 500 MG tablet  Anal skin tag - Plan: Ambulatory referral to General Surgery  Screening for colon cancer - Plan: Ambulatory referral to Gastroenterology  Benign prostatic hyperplasia with nocturia Usually he requires a month treatment with Cipro which I will give. Will also refer to general surgery has he would like the skin tags removed. He is also in need of a colonoscopy. He will call if further trouble with the prostate.

## 2016-08-31 ENCOUNTER — Telehealth: Payer: Self-pay | Admitting: Family Medicine

## 2016-08-31 MED ORDER — SULFAMETHOXAZOLE-TRIMETHOPRIM 800-160 MG PO TABS: 1.0000 | ORAL_TABLET | Freq: Two times a day (BID) | ORAL | 0 refills | Status: DC

## 2016-08-31 NOTE — Telephone Encounter (Signed)
He called stating that the medicine doesn't seem to be working as well as in the past. I will therefore switch him to Septra.

## 2016-08-31 NOTE — Telephone Encounter (Signed)
Pt said prostate infection did get better when he first started antibiotic but prostate infection has started to get worse again.

## 2016-09-19 ENCOUNTER — Telehealth: Payer: Self-pay | Admitting: Family Medicine

## 2016-09-19 NOTE — Telephone Encounter (Signed)
Pt called needs renewal states it's helping but not completed cured.  P.A. ITRACONAZOLE approved, pt informed.  Called pharmacy & pt does not have any refills remaining.   Can you refill Itraconazole to pharmacy

## 2016-09-20 ENCOUNTER — Other Ambulatory Visit: Payer: Self-pay | Admitting: Medical

## 2016-09-20 NOTE — Telephone Encounter (Signed)
This is Dr. Lanice Shirts patient.   Nevertheless, he needs a liver test repeated before continuing oral antifungal.  So have him recheck

## 2016-09-21 NOTE — Telephone Encounter (Signed)
Pt informed and appt made.

## 2016-09-28 ENCOUNTER — Other Ambulatory Visit: Payer: Self-pay | Admitting: Family Medicine

## 2016-09-28 ENCOUNTER — Encounter: Payer: Self-pay | Admitting: Family Medicine

## 2016-09-28 ENCOUNTER — Ambulatory Visit (INDEPENDENT_AMBULATORY_CARE_PROVIDER_SITE_OTHER): Payer: PRIVATE HEALTH INSURANCE | Admitting: Family Medicine

## 2016-09-28 VITALS — BP 110/70 | HR 72 | Wt 176.0 lb

## 2016-09-28 DIAGNOSIS — B351 Tinea unguium: Secondary | ICD-10-CM | POA: Diagnosis not present

## 2016-09-28 DIAGNOSIS — Z79899 Other long term (current) drug therapy: Secondary | ICD-10-CM | POA: Diagnosis not present

## 2016-09-28 DIAGNOSIS — Z23 Encounter for immunization: Secondary | ICD-10-CM

## 2016-09-28 LAB — CBC WITH DIFFERENTIAL/PLATELET
BASOS ABS: 0 {cells}/uL (ref 0–200)
BASOS PCT: 0 %
Basophils Absolute: 0 cells/uL (ref 0–200)
Basophils Relative: 0 %
EOS ABS: 225 {cells}/uL (ref 15–500)
EOS PCT: 3 %
Eosinophils Absolute: 225 cells/uL (ref 15–500)
Eosinophils Relative: 3 %
HEMATOCRIT: 40.8 % (ref 38.5–50.0)
HEMATOCRIT: 40.8 % (ref 38.5–50.0)
HEMOGLOBIN: 14 g/dL (ref 13.2–17.1)
Hemoglobin: 14 g/dL (ref 13.2–17.1)
LYMPHS ABS: 1800 {cells}/uL (ref 850–3900)
Lymphocytes Relative: 24 %
Lymphocytes Relative: 24 %
Lymphs Abs: 1800 cells/uL (ref 850–3900)
MCH: 28.7 pg (ref 27.0–33.0)
MCH: 28.7 pg (ref 27.0–33.0)
MCHC: 34.3 g/dL (ref 32.0–36.0)
MCHC: 34.3 g/dL (ref 32.0–36.0)
MCV: 83.6 fL (ref 80.0–100.0)
MCV: 83.6 fL (ref 80.0–100.0)
MONO ABS: 600 {cells}/uL (ref 200–950)
MONO ABS: 600 {cells}/uL (ref 200–950)
MONOS PCT: 8 %
MPV: 9.6 fL (ref 7.5–12.5)
MPV: 9.6 fL (ref 7.5–12.5)
Monocytes Relative: 8 %
NEUTROS ABS: 4875 {cells}/uL (ref 1500–7800)
NEUTROS ABS: 4875 {cells}/uL (ref 1500–7800)
Neutrophils Relative %: 65 %
Neutrophils Relative %: 65 %
PLATELETS: 251 10*3/uL (ref 140–400)
Platelets: 251 10*3/uL (ref 140–400)
RBC: 4.88 MIL/uL (ref 4.20–5.80)
RBC: 4.88 MIL/uL (ref 4.20–5.80)
RDW: 13.6 % (ref 11.0–15.0)
RDW: 13.6 % (ref 11.0–15.0)
WBC: 7.5 10*3/uL (ref 4.0–10.5)
WBC: 7.5 10*3/uL (ref 4.0–10.5)

## 2016-09-28 LAB — COMPREHENSIVE METABOLIC PANEL
ALBUMIN: 4.6 g/dL (ref 3.6–5.1)
ALT: 46 U/L (ref 9–46)
AST: 26 U/L (ref 10–35)
Alkaline Phosphatase: 68 U/L (ref 40–115)
BUN: 15 mg/dL (ref 7–25)
CALCIUM: 9.3 mg/dL (ref 8.6–10.3)
CHLORIDE: 104 mmol/L (ref 98–110)
CO2: 24 mmol/L (ref 20–31)
Creat: 0.9 mg/dL (ref 0.70–1.33)
Glucose, Bld: 97 mg/dL (ref 65–99)
POTASSIUM: 4 mmol/L (ref 3.5–5.3)
Sodium: 138 mmol/L (ref 135–146)
TOTAL PROTEIN: 7.1 g/dL (ref 6.1–8.1)
Total Bilirubin: 0.4 mg/dL (ref 0.2–1.2)

## 2016-09-28 MED ORDER — ITRACONAZOLE 100 MG PO CAPS: ORAL_CAPSULE | ORAL | 0 refills | Status: DC

## 2016-09-28 NOTE — Addendum Note (Signed)
Addended by: Denita Lung on: 09/28/2016 04:49 PM   Modules accepted: Orders

## 2016-09-28 NOTE — Progress Notes (Signed)
   Subjective:    Patient ID: Daniel Cherry, male    DOB: 18-Nov-1965, 50 y.o.   MRN: [id number]  HPI He is here for recheck on his onychomycosis. He states that he is almost back to normal and would like one more 3 month course. He has had no difficulty with this medication. He would like a flu shot today.   Review of Systems     Objective:   Physical Exam Exam of his toes does show almost complete resolution with only the last several millimeter still showing some thickness.       Assessment & Plan:  Need for prophylactic vaccination and inoculation against influenza - Plan: Flu Vaccine QUAD 36+ mos PF IM (Fluarix & Fluzone Quad PF)  Onychomycosis - Plan: itraconazole (SPORANOX) 100 MG capsule  Encounter for long-term (current) use of medications - Plan: itraconazole (SPORANOX) 100 MG capsule I explained that he probably would not need this other prescription but he like to be safe and I will therefore give him another 3 months.

## 2016-10-11 ENCOUNTER — Ambulatory Visit (AMBULATORY_SURGERY_CENTER): Payer: Self-pay | Admitting: *Deleted

## 2016-10-11 VITALS — Ht 68.0 in | Wt 176.0 lb

## 2016-10-11 DIAGNOSIS — Z1211 Encounter for screening for malignant neoplasm of colon: Secondary | ICD-10-CM

## 2016-10-11 MED ORDER — NA SULFATE-K SULFATE-MG SULF 17.5-3.13-1.6 GM/177ML PO SOLN: ORAL | 0 refills | Status: DC

## 2016-10-11 NOTE — Progress Notes (Signed)
Patient denies any allergies to eggs or soy. Patient denies any problems with anesthesia/sedation. Patient denies any oxygen use at home and does not take any diet/weight loss medications. EMMI education assisgned to patient on colonoscopy, this was explained and instructions given to patient. 

## 2016-11-01 ENCOUNTER — Encounter: Payer: Self-pay | Admitting: Gastroenterology

## 2016-11-01 ENCOUNTER — Ambulatory Visit (AMBULATORY_SURGERY_CENTER): Payer: PRIVATE HEALTH INSURANCE | Admitting: Gastroenterology

## 2016-11-01 VITALS — BP 109/62 | HR 63 | Temp 98.0°F | Resp 15 | Ht 68.0 in | Wt 176.0 lb

## 2016-11-01 DIAGNOSIS — D125 Benign neoplasm of sigmoid colon: Secondary | ICD-10-CM

## 2016-11-01 DIAGNOSIS — D123 Benign neoplasm of transverse colon: Secondary | ICD-10-CM

## 2016-11-01 DIAGNOSIS — K635 Polyp of colon: Secondary | ICD-10-CM

## 2016-11-01 DIAGNOSIS — Z1211 Encounter for screening for malignant neoplasm of colon: Secondary | ICD-10-CM | POA: Diagnosis present

## 2016-11-01 DIAGNOSIS — Z1212 Encounter for screening for malignant neoplasm of rectum: Secondary | ICD-10-CM | POA: Diagnosis not present

## 2016-11-01 DIAGNOSIS — D122 Benign neoplasm of ascending colon: Secondary | ICD-10-CM | POA: Diagnosis not present

## 2016-11-01 DIAGNOSIS — D126 Benign neoplasm of colon, unspecified: Secondary | ICD-10-CM

## 2016-11-01 HISTORY — PX: COLOSTOMY: SHX63

## 2016-11-01 MED ORDER — SODIUM CHLORIDE 0.9 % IV SOLN: 500.0000 mL | INTRAVENOUS | Status: DC

## 2016-11-01 NOTE — Progress Notes (Signed)
Called to room to assist during endoscopic procedure.  Patient ID and intended procedure confirmed with present staff. Received instructions for my participation in the procedure from the performing physician.  

## 2016-11-01 NOTE — Progress Notes (Signed)
No problems noted in the recovery room. maw 

## 2016-11-01 NOTE — Op Note (Signed)
[place] Patient Name: Daniel Cherry Procedure Date: 11/01/2016 9:09 AM MRN: [id number] Endoscopist: Remo Lipps P. Armbruster MD, MD Age: 51 Referring MD:  Date of Birth: [birth year] Gender: Male Account #: [id number] Procedure:                Colonoscopy Indications:              Screening for malignant neoplasm in the colon, This                            is the patient's first colonoscopy Medicines:                Monitored Anesthesia Care Procedure:                Pre-Anesthesia Assessment:                           - Prior to the procedure, a History and Physical                            was performed, and patient medications and                            allergies were reviewed. The patient's tolerance of                            previous anesthesia was also reviewed. The risks                            and benefits of the procedure and the sedation                            options and risks were discussed with the patient.                            All questions were answered, and informed consent                            was obtained. Prior Anticoagulants: The patient has                            taken no previous anticoagulant or antiplatelet                            agents. ASA Grade Assessment: II - A patient with                            mild systemic disease. After reviewing the risks                            and benefits, the patient was deemed in                            satisfactory condition to undergo the procedure.  After obtaining informed consent, the colonoscope                            was passed under direct vision. Throughout the                            procedure, the patient's blood pressure, pulse, and                            oxygen saturations were monitored continuously. The                            Model CF-HQ190L [id number]) scope was introduced                            through the anus and  advanced to the the cecum,                            identified by appendiceal orifice and ileocecal                            valve. The colonoscopy was performed without                            difficulty. The patient tolerated the procedure                            well. The quality of the bowel preparation was                            good. The ileocecal valve, appendiceal orifice, and                            rectum were photographed. Scope In: 9:14:52 AM Scope Out: 9:36:11 AM Scope Withdrawal Time: 0 hours 18 minutes 35 seconds  Total Procedure Duration: 0 hours 21 minutes 19 seconds  Findings:                 The perianal and digital rectal examinations were                            normal.                           A few small-mouthed diverticula were found in the                            ascending colon.                           Two sessile polyps were found in the ascending                            colon. The polyps were 4 to 5 mm in size. These  polyps were removed with a cold snare. Resection                            and retrieval were complete.                           A 5 mm polyp was found in the transverse colon. The                            polyp was sessile. The polyp was removed with a                            cold snare. Resection and retrieval were complete.                           A 5 mm polyp was found in the sigmoid colon. The                            polyp was sessile. The polyp was removed with a                            cold snare. Resection and retrieval were complete.                           Internal hemorrhoids were found during retroflexion.                           The right colon was tortous. The exam was otherwise                            without abnormality. Complications:            No immediate complications. Estimated blood loss:                            Minimal. Estimated Blood Loss:      Estimated blood loss was minimal. Impression:               - Diverticulosis in the ascending colon.                           - Two 4 to 5 mm polyps in the ascending colon,                            removed with a cold snare. Resected and retrieved.                           - One 5 mm polyp in the transverse colon, removed                            with a cold snare. Resected and retrieved.                           - One 5  mm polyp in the sigmoid colon, removed with                            a cold snare. Resected and retrieved.                           - Internal hemorrhoids.                           - The examination was otherwise normal. Recommendation:           - Patient has a contact number available for                            emergencies. The signs and symptoms of potential                            delayed complications were discussed with the                            patient. Return to normal activities tomorrow.                            Written discharge instructions were provided to the                            patient.                           - Resume previous diet.                           - Continue present medications.                           - No ibuprofen, naproxen, or other non-steroidal                            anti-inflammatory drugs for 2 weeks after polyp                            removal.                           - Await pathology results.                           - Repeat colonoscopy is recommended for                            surveillance. The colonoscopy date will be                            determined after pathology results from today's                            exam become available for review. Remo Lipps P.  Armbruster MD, MD 11/01/2016 9:40:30 AM This report has been signed electronically.

## 2016-11-01 NOTE — Patient Instructions (Signed)
YOU HAD AN ENDOSCOPIC PROCEDURE TODAY AT [facility]:   Refer to the procedure report that was given to you for any specific questions about what was found during the examination.  If the procedure report does not answer your questions, please call your gastroenterologist to clarify.  If you requested that your care partner not be given the details of your procedure findings, then the procedure report has been included in a sealed envelope for you to review at your convenience later.  YOU SHOULD EXPECT: Some feelings of bloating in the abdomen. Passage of more gas than usual.  Walking can help get rid of the air that was put into your GI tract during the procedure and reduce the bloating. If you had a lower endoscopy (such as a colonoscopy or flexible sigmoidoscopy) you may notice spotting of blood in your stool or on the toilet paper. If you underwent a bowel prep for your procedure, you may not have a normal bowel movement for a few days.  Please Note:  You might notice some irritation and congestion in your nose or some drainage.  This is from the oxygen used during your procedure.  There is no need for concern and it should clear up in a day or so.  SYMPTOMS TO REPORT IMMEDIATELY:   Following lower endoscopy (colonoscopy or flexible sigmoidoscopy):  Excessive amounts of blood in the stool  Significant tenderness or worsening of abdominal pains  Swelling of the abdomen that is new, acute  Fever of 100F or higher   Following upper endoscopy (EGD)  Vomiting of blood or coffee ground material  New chest pain or pain under the shoulder blades  Painful or persistently difficult swallowing  New shortness of breath  Fever of 100F or higher  Black, tarry-looking stools  For urgent or emergent issues, a gastroenterologist can be reached at any hour by calling [phone].   DIET:  We do recommend a small meal at first, but then you may proceed to your regular diet.  Drink  plenty of fluids but you should avoid alcoholic beverages for 24 hours.  ACTIVITY:  You should plan to take it easy for the rest of today and you should NOT DRIVE or use heavy machinery until tomorrow (because of the sedation medicines used during the test).    FOLLOW UP: Our staff will call the number listed on your records the next business day following your procedure to check on you and address any questions or concerns that you may have regarding the information given to you following your procedure. If we do not reach you, we will leave a message.  However, if you are feeling well and you are not experiencing any problems, there is no need to return our call.  We will assume that you have returned to your regular daily activities without incident.  If any biopsies were taken you will be contacted by phone or by letter within the next 1-3 weeks.  Please call us at [phone] if you have not heard about the biopsies in 3 weeks.    SIGNATURES/CONFIDENTIALITY: You and/or your care partner have signed paperwork which will be entered into your electronic medical record.  These signatures attest to the fact that that the information above on your After Visit Summary has been reviewed and is understood.  Full responsibility of the confidentiality of this discharge information lies with you and/or your care-partner.     Handouts were given to your care partner on  polyps, diverticulosis, and hemorrhoids. No aspirin, aspirin products,  ibuprofen, naproxen, advil, motrin, aleve, or other non-steroidal anti-inflammatory drugs for 14  days after polyp removal. You may resume your current medications today. Await biopsy results. Please call if any questions or concerns.

## 2016-11-01 NOTE — Progress Notes (Signed)
A and O x3. Report to RN. Tolerated MAC anesthesia well.

## 2016-11-02 ENCOUNTER — Telehealth: Payer: Self-pay | Admitting: *Deleted

## 2016-11-02 NOTE — Telephone Encounter (Signed)
  Follow up Call-  Call back number 11/01/2016  Post procedure Call Back phone  # [phone]  Permission to leave phone message Yes  Some recent data might be hidden     Patient questions:  Do you have a fever, pain , or abdominal swelling? No. Pain Score  0 *  Have you tolerated food without any problems? Yes.    Have you been able to return to your normal activities? Yes.    Do you have any questions about your discharge instructions: Diet   No. Medications  No. Follow up visit  No.  Do you have questions or concerns about your Care? No.  Actions: * If pain score is 4 or above: No action needed, pain <4.

## 2016-11-07 ENCOUNTER — Encounter: Payer: Self-pay | Admitting: Gastroenterology

## 2016-12-04 ENCOUNTER — Encounter: Payer: Self-pay | Admitting: *Deleted

## 2016-12-05 ENCOUNTER — Encounter: Payer: Self-pay | Admitting: Diagnostic Neuroimaging

## 2016-12-05 ENCOUNTER — Ambulatory Visit (INDEPENDENT_AMBULATORY_CARE_PROVIDER_SITE_OTHER): Payer: PRIVATE HEALTH INSURANCE | Admitting: Diagnostic Neuroimaging

## 2016-12-05 VITALS — BP 119/75 | HR 77 | Ht 68.0 in | Wt 177.0 lb

## 2016-12-05 DIAGNOSIS — G8929 Other chronic pain: Secondary | ICD-10-CM

## 2016-12-05 DIAGNOSIS — R350 Frequency of micturition: Secondary | ICD-10-CM

## 2016-12-05 DIAGNOSIS — M5442 Lumbago with sciatica, left side: Secondary | ICD-10-CM | POA: Diagnosis not present

## 2016-12-05 DIAGNOSIS — M5441 Lumbago with sciatica, right side: Secondary | ICD-10-CM | POA: Diagnosis not present

## 2016-12-05 DIAGNOSIS — R3915 Urgency of urination: Secondary | ICD-10-CM

## 2016-12-05 NOTE — Patient Instructions (Signed)

## 2016-12-05 NOTE — Progress Notes (Addendum)
[facility]  PATIENT: Daniel Cherry DOB: 1966-07-08  REFERRING CLINICIAN: Jerilynn Mages Eskridge HISTORY FROM: patient  REASON FOR VISIT: new consult    HISTORICAL  CHIEF COMPLAINT:  Chief Complaint  Patient presents with  . Bladder frequency    rm 7, New Pt, "issues with urination x 9-10 years"  . Eval/ image for occult neurologic disease    HISTORY OF PRESENT ILLNESS:  51 year old male here for evaluation of bladder frequency, nocturia and urgency. For past 10 years patient has had gradual onset progressive problems with urination. He reports having to have increasing straining, interrupted flow, feeling of incomplete emptying when he urinates. Initially this was felt to be some type of prostate related problem and he was treated with finasteride and tamsulosin without benefit. Patient has followed up with urology several times who found no specific cause of his symptoms. Therefore patient referred here for evaluation of possible occult neurologic issue or cause of his bladder problems.  Patient denies any problems with bowel movements. No problems with his arms, legs, numbness, tingling, balance or gait difficulty. No headaches, vision changes, slurred speech, trouble talking or walking.  Patient has history of low back pain problems from 15 years ago, sometimes with pain rating to the right or left side. These are treated conservatively.   REVIEW OF SYSTEMS: Full 14 system review of systems performed and negative with exception of: Urination problem is.  ALLERGIES: No Known Allergies  HOME MEDICATIONS: Outpatient Medications Prior to Visit  Medication Sig Dispense Refill  . finasteride (PROSCAR) 5 MG tablet TAKE 1 TABLET BY MOUTH EVERY DAY 30 tablet 10  . mirtazapine (REMERON) 30 MG tablet Take 1 tablet (30 mg total) by mouth 2 (two) times daily. 60 tablet 5  . QUEtiapine (SEROQUEL) 25 MG tablet Take 25 mg by mouth at bedtime.    . Ramelteon (ROZEREM PO) Take 1  each by mouth at bedtime as needed. 8mg  tablets    . vortioxetine HBr (TRINTELLIX) 20 MG TABS Take 20 mg by mouth daily.     Facility-Administered Medications Prior to Visit  Medication Dose Route Frequency Provider Last Rate Last Dose  . 0.9 %  sodium chloride infusion  500 mL Intravenous Continuous Manus Gunning, MD        PAST MEDICAL HISTORY: Past Medical History:  Diagnosis Date  . Anxiety   . Depression     PAST SURGICAL HISTORY: Past Surgical History:  Procedure Laterality Date  . DERMABRASION  1986    FAMILY HISTORY: Family History  Problem Relation Age of Onset  . Heart disease Father   . Hypertension Father   . Hyperlipidemia Father   . Heart disease Sister   . Hyperlipidemia Sister   . Hyperlipidemia Mother   . Dementia Brother 60    multiple illnesses  . Heart disease Brother   . Hyperlipidemia Brother   . Cancer - Prostate Maternal Uncle   . Cancer - Prostate Paternal Uncle   . Colon cancer Neg Hx   . Colon polyps Neg Hx   . Esophageal cancer Neg Hx   . Stomach cancer Neg Hx   . Rectal cancer Neg Hx     SOCIAL HISTORY:  Social History   Social History  . Marital status: Single    Spouse name: N/A  . Number of children: 0  . Years of education: 68   Occupational History  .      SW, [employer] History Main Topics  .  Smoking status: Never Smoker  . Smokeless tobacco: Never Used  . Alcohol use 0.6 oz/week    1 Glasses of wine per week     Comment: 2-3 monthly  . Drug use: No  . Sexual activity: Yes   Other Topics Concern  . Not on file   Social History Narrative   Lives with partner   Caffeine- sodas, 1-2 daily     PHYSICAL EXAM  GENERAL EXAM/CONSTITUTIONAL: Vitals:  Vitals:   12/05/16 0851  BP: 119/75  Pulse: 77  Weight: 177 lb (80.3 kg)  Height: 5\' 8"  (1.727 m)     Body mass index is 26.91 kg/m.  Visual Acuity Screening   Right eye Left eye Both eyes  Without correction:     With  correction: 20/20 20/30      Patient is in no distress; well developed, nourished and groomed; neck is supple  CARDIOVASCULAR:  Examination of carotid arteries is normal; no carotid bruits  Regular rate and rhythm, no murmurs  Examination of peripheral vascular system by observation and palpation is normal  EYES:  Ophthalmoscopic exam of optic discs and posterior segments is normal; no papilledema or hemorrhages  MUSCULOSKELETAL:  Gait, strength, tone, movements noted in Neurologic exam below  NEUROLOGIC: MENTAL STATUS:  No flowsheet data found.  awake, alert, oriented to person, place and time  recent and remote memory intact  normal attention and concentration  language fluent, comprehension intact, naming intact,   fund of knowledge appropriate  CRANIAL NERVE:   2nd - no papilledema on fundoscopic exam  2nd, 3rd, 4th, 6th - pupils equal and reactive to light, visual fields full to confrontation, extraocular muscles intact, no nystagmus  5th - facial sensation symmetric  7th - facial strength symmetric  8th - hearing intact  9th - palate elevates symmetrically, uvula midline  11th - shoulder shrug symmetric  12th - tongue protrusion midline  MOTOR:   normal bulk and tone, full strength in the BUE, BLE  SENSORY:   normal and symmetric to light touch, temperature, vibration  COORDINATION:   finger-nose-finger, fine finger movements normal  REFLEXES:   deep tendon reflexes present and symmetric  GAIT/STATION:   narrow based gait; able to walk on tandem; romberg is negative    DIAGNOSTIC DATA (LABS, IMAGING, TESTING) - I reviewed patient records, labs, notes, testing and imaging myself where available.  Lab Results  Component Value Date   WBC 7.5 09/28/2016   WBC 7.5 09/28/2016   HGB 14.0 09/28/2016   HGB 14.0 09/28/2016   HCT 40.8 09/28/2016   HCT 40.8 09/28/2016   MCV 83.6 09/28/2016   MCV 83.6 09/28/2016   PLT 251 09/28/2016    PLT 251 09/28/2016      Component Value Date/Time   NA 138 09/28/2016 1547   K 4.0 09/28/2016 1547   CL 104 09/28/2016 1547   CO2 24 09/28/2016 1547   GLUCOSE 97 09/28/2016 1547   BUN 15 09/28/2016 1547   CREATININE 0.90 09/28/2016 1547   CALCIUM 9.3 09/28/2016 1547   PROT 7.1 09/28/2016 1547   ALBUMIN 4.6 09/28/2016 1547   AST 26 09/28/2016 1547   ALT 46 09/28/2016 1547   ALKPHOS 68 09/28/2016 1547   BILITOT 0.4 09/28/2016 1547   Lab Results  Component Value Date   CHOL 207 (H) 07/19/2015   HDL 34 (L) 07/19/2015   LDLCALC 121 07/19/2015   TRIG 261 (H) 07/19/2015   CHOLHDL 6.1 (H) 07/19/2015   Lab Results  Component Value Date   HGBA1C 5.5 05/02/2016   No results found for: [id number] Lab Results  Component Value Date   TSH 0.929 03/30/2011        ASSESSMENT AND PLAN  51 y.o. year old male here with idiopathic urinary frequency, incomplete bladder emptying, urgency symptoms. No other neurologic signs or symptoms to definitely localize the problem. Patient does have history of lumbar spine/low back pain problems and therefore it would be reasonable to check an MRI scan lumbar spine for evaluation. Also will check neuropathy labs.   Ddx: lumbar spinal stenosis, lumbo-sacral radiculopathy, neuropathy  1. Frequent urination   2. Urgency of urination   3. Chronic bilateral low back pain with bilateral sciatica      PLAN: - check MRI lumbar and labs  Orders Placed This Encounter  Procedures  . MR LUMBAR SPINE WO CONTRAST  . Neuropathy Panel   Return in about 3 months (around 03/04/2017).    Penni Bombard, MD [id number], [id number] AM Certified in Neurology, Neurophysiology and Neuroimaging  [facility] [address] [place], [place] [address] [phone]

## 2016-12-08 ENCOUNTER — Telehealth: Payer: Self-pay | Admitting: *Deleted

## 2016-12-08 LAB — NEUROPATHY PANEL
A/G RATIO SPE: 1.2 (ref 0.7–1.7)
ALPHA 2: 0.8 g/dL (ref 0.4–1.0)
ANA: NEGATIVE
ANGIO CONVERT ENZYME: 37 U/L (ref 14–82)
Albumin ELP: 4.1 g/dL (ref 2.9–4.4)
Alpha 1: 0.2 g/dL (ref 0.0–0.4)
Beta: 1.1 g/dL (ref 0.7–1.3)
GLOBULIN, TOTAL: 3.3 g/dL (ref 2.2–3.9)
Gamma Globulin: 1.2 g/dL (ref 0.4–1.8)
SED RATE: 10 mm/h (ref 0–30)
TOTAL PROTEIN: 7.4 g/dL (ref 6.0–8.5)
TSH: 1.05 u[IU]/mL (ref 0.450–4.500)
VIT D 25 HYDROXY: 16.4 ng/mL — AB (ref 30.0–100.0)
Vitamin B-12: 329 pg/mL (ref 232–1245)

## 2016-12-08 NOTE — Telephone Encounter (Signed)
LVM informing patient he has labs still pending, however his thyroid level is good, but vitamin D level is low. Advised him Dr Leta Baptist recommends he buy OTC Vit D3 and take 1000 units daily. Then advised he call [facility] and schedule his MRI, gave him number. Left number for this office and advised we close at noon today.

## 2016-12-12 ENCOUNTER — Telehealth: Payer: Self-pay | Admitting: *Deleted

## 2016-12-12 NOTE — Telephone Encounter (Signed)
Per Dr Leta Baptist, LVM informing patient his neuropathy lab results are unremarkable. Reminded him to take Vit D 1000 units daily as left in earlier VM. Left number for any questions.

## 2016-12-26 ENCOUNTER — Ambulatory Visit
Admission: RE | Admit: 2016-12-26 | Discharge: 2016-12-26 | Disposition: A | Discharge status: Home / Self Care | Payer: PRIVATE HEALTH INSURANCE | Source: Ambulatory Visit | Attending: Diagnostic Neuroimaging | Admitting: Diagnostic Neuroimaging

## 2016-12-26 DIAGNOSIS — R350 Frequency of micturition: Secondary | ICD-10-CM

## 2016-12-26 DIAGNOSIS — G8929 Other chronic pain: Secondary | ICD-10-CM

## 2016-12-26 DIAGNOSIS — M5442 Lumbago with sciatica, left side: Secondary | ICD-10-CM

## 2016-12-26 DIAGNOSIS — M5441 Lumbago with sciatica, right side: Secondary | ICD-10-CM

## 2016-12-26 DIAGNOSIS — R3915 Urgency of urination: Secondary | ICD-10-CM

## 2017-01-04 ENCOUNTER — Telehealth: Payer: Self-pay | Admitting: *Deleted

## 2017-01-04 NOTE — Telephone Encounter (Signed)
Per Dr Leta Baptist, LVM advising patient his MRI lumbar spine results are unremarkable, with minimal changes at L4-5. Advised Dr Leta Baptist will continue with current treatment plan. Advised he monitor his symptoms and call for any questions, problems prior to his FU in May. Left number.

## 2017-01-09 ENCOUNTER — Ambulatory Visit (INDEPENDENT_AMBULATORY_CARE_PROVIDER_SITE_OTHER): Payer: PRIVATE HEALTH INSURANCE | Admitting: Family Medicine

## 2017-01-09 ENCOUNTER — Encounter: Payer: Self-pay | Admitting: Family Medicine

## 2017-01-09 VITALS — BP 110/68 | HR 68 | Temp 98.0°F | Wt 177.4 lb

## 2017-01-09 DIAGNOSIS — J3089 Other allergic rhinitis: Secondary | ICD-10-CM | POA: Diagnosis not present

## 2017-01-09 NOTE — Progress Notes (Signed)
   Subjective:    Patient ID: Daniel Cherry, male    DOB: [birth year], 51 y.o.   MRN: [id number]  HPI He complains of a 6 week history of seeing blood when he blows his nose. He comes out of both sides. He has been using Flonase 2 puffs in each nostril twice per day for the last 6 months. He is also using OTC allergy medications. He has no difficulty with sneezing, itchy watery eyes, rhinorrhea. He does not smoke.   Review of Systems     Objective:   Physical Exam Alert and in no distress. Nasal mucosa is slightly erythematous, left nares does show evidence of recent bleeding. Tympanic membranes and canals are normal. Pharyngeal area is normal. Neck is supple without adenopathy or thyromegaly. Cardiac exam shows a regular sinus rhythm without murmurs or gallops. Lungs are clear to auscultation.        Assessment & Plan:  Chronic non-seasonal allergic rhinitis, unspecified trigger I explained that I thought the Flonase is the cause of this and he is using excessive amounts. Recommend he switch to Rhinocort. Also he will use AYR to help moisturize the nose.

## 2017-01-17 ENCOUNTER — Telehealth: Payer: Self-pay | Admitting: *Deleted

## 2017-01-17 NOTE — Telephone Encounter (Signed)
LVM requesting patient call back and reschedule his follow up. Dr on vacation. Advised Dr Leta Baptist is booking in July, and he may see NP on a day Dr Leta Baptist is in the office if he is agreeable. Requested he call back to reschedule.

## 2017-03-05 ENCOUNTER — Ambulatory Visit: Payer: PRIVATE HEALTH INSURANCE | Admitting: Diagnostic Neuroimaging

## 2017-06-25 ENCOUNTER — Other Ambulatory Visit: Payer: Self-pay | Admitting: Family Medicine

## 2017-06-25 DIAGNOSIS — N400 Enlarged prostate without lower urinary tract symptoms: Secondary | ICD-10-CM

## 2017-07-09 ENCOUNTER — Encounter: Payer: Self-pay | Admitting: Family Medicine

## 2017-07-28 ENCOUNTER — Other Ambulatory Visit: Payer: Self-pay | Admitting: Family Medicine

## 2017-07-28 DIAGNOSIS — N400 Enlarged prostate without lower urinary tract symptoms: Secondary | ICD-10-CM

## 2017-07-30 NOTE — Telephone Encounter (Signed)
Pt hasn't been seen in a while for this. Is this okay to refill and schedule him for a med check

## 2017-07-30 NOTE — Telephone Encounter (Signed)
Pt is coming in November for med check.

## 2017-09-04 ENCOUNTER — Encounter: Payer: Self-pay | Admitting: Family Medicine

## 2017-09-04 ENCOUNTER — Ambulatory Visit (INDEPENDENT_AMBULATORY_CARE_PROVIDER_SITE_OTHER): Payer: PRIVATE HEALTH INSURANCE | Admitting: Family Medicine

## 2017-09-04 VITALS — BP 112/60 | HR 72 | Resp 16 | Wt 173.0 lb

## 2017-09-04 DIAGNOSIS — Z23 Encounter for immunization: Secondary | ICD-10-CM | POA: Diagnosis not present

## 2017-09-04 DIAGNOSIS — F325 Major depressive disorder, single episode, in full remission: Secondary | ICD-10-CM | POA: Diagnosis not present

## 2017-09-04 DIAGNOSIS — E785 Hyperlipidemia, unspecified: Secondary | ICD-10-CM

## 2017-09-04 DIAGNOSIS — Z860100 Personal history of colon polyps, unspecified: Secondary | ICD-10-CM

## 2017-09-04 DIAGNOSIS — E781 Pure hyperglyceridemia: Secondary | ICD-10-CM | POA: Diagnosis not present

## 2017-09-04 DIAGNOSIS — F419 Anxiety disorder, unspecified: Secondary | ICD-10-CM | POA: Diagnosis not present

## 2017-09-04 DIAGNOSIS — R351 Nocturia: Secondary | ICD-10-CM | POA: Diagnosis not present

## 2017-09-04 DIAGNOSIS — N401 Enlarged prostate with lower urinary tract symptoms: Secondary | ICD-10-CM | POA: Diagnosis not present

## 2017-09-04 LAB — CBC WITH DIFFERENTIAL/PLATELET
BASOS ABS: 38 {cells}/uL (ref 0–200)
Basophils Relative: 0.6 %
EOS ABS: 340 {cells}/uL (ref 15–500)
EOS PCT: 5.4 %
HEMATOCRIT: 39 % (ref 38.5–50.0)
Hemoglobin: 13.9 g/dL (ref 13.2–17.1)
LYMPHS ABS: 1751 {cells}/uL (ref 850–3900)
MCH: 29.3 pg (ref 27.0–33.0)
MCHC: 35.6 g/dL (ref 32.0–36.0)
MCV: 82.1 fL (ref 80.0–100.0)
MPV: 10.7 fL (ref 7.5–12.5)
Monocytes Relative: 8.5 %
NEUTROS PCT: 57.7 %
Neutro Abs: 3635 cells/uL (ref 1500–7800)
Platelets: 243 10*3/uL (ref 140–400)
RBC: 4.75 10*6/uL (ref 4.20–5.80)
RDW: 12.5 % (ref 11.0–15.0)
Total Lymphocyte: 27.8 %
WBC: 6.3 10*3/uL (ref 3.8–10.8)
WBCMIX: 536 {cells}/uL (ref 200–950)

## 2017-09-04 LAB — COMPREHENSIVE METABOLIC PANEL
AG Ratio: 1.9 (calc) (ref 1.0–2.5)
ALT: 135 U/L — AB (ref 9–46)
AST: 57 U/L — AB (ref 10–35)
Albumin: 4.5 g/dL (ref 3.6–5.1)
Alkaline phosphatase (APISO): 77 U/L (ref 40–115)
BILIRUBIN TOTAL: 0.3 mg/dL (ref 0.2–1.2)
BUN: 13 mg/dL (ref 7–25)
CALCIUM: 9.2 mg/dL (ref 8.6–10.3)
CO2: 26 mmol/L (ref 20–32)
Chloride: 105 mmol/L (ref 98–110)
Creat: 0.8 mg/dL (ref 0.70–1.33)
Globulin: 2.4 g/dL (calc) (ref 1.9–3.7)
Glucose, Bld: 97 mg/dL (ref 65–99)
Potassium: 3.8 mmol/L (ref 3.5–5.3)
Sodium: 139 mmol/L (ref 135–146)
Total Protein: 6.9 g/dL (ref 6.1–8.1)

## 2017-09-04 LAB — LIPID PANEL
CHOLESTEROL: 225 mg/dL — AB (ref <200)
HDL: 37 mg/dL — ABNORMAL LOW (ref >40)
LDL CHOLESTEROL (CALC): 133 mg/dL — AB
Non-HDL Cholesterol (Calc): 188 mg/dL (calc) — ABNORMAL HIGH (ref <130)
TRIGLYCERIDES: 386 mg/dL — AB (ref <150)
Total CHOL/HDL Ratio: 6.1 (calc) — ABNORMAL HIGH (ref <5.0)

## 2017-09-04 NOTE — Progress Notes (Signed)
   Subjective:    Patient ID: Daniel Cherry, male    DOB: February 15, 1966, 51 y.o.   MRN: [id number]  HPI He is here for an interval evaluation.  He continues to be followed by his psychiatrist and seems to be doing well on his present medication regimen.  Presently he is mainly dealing with anxiety issues and no longer has any problems with depression.  He also has a history of colonic polyps and is in a routine pattern for reevaluation of that.  Continues to do well on finasteride for treatment of his BPH and urinary symptoms revolving around that.  He also has a history of hyperlipidemia as well as hypertriglyceridemia.  He did eat roughly 2 hours ago.  Otherwise he has no particular concerns or complaints.  Review of Systems     Objective:   Physical Exam Alert and in no distress. Tympanic membranes and canals are normal. Pharyngeal area is normal. Neck is supple without adenopathy or thyromegaly. Cardiac exam shows a regular sinus rhythm without murmurs or gallops. Lungs are clear to auscultation.         Assessment & Plan:  Need for prophylactic vaccination and inoculation against influenza - Plan: Flu Vaccine QUAD 6+ mos PF IM (Fluarix Quad PF)  History of colonic polyps - Plan: CBC with Differential/Platelet, Comprehensive metabolic panel  Anxiety  Depression, major, in remission (HCC)  Benign prostatic hyperplasia with nocturia - Plan: CBC with Differential/Platelet, Comprehensive metabolic panel, Lipid panel  Hyperlipidemia LDL goal <100 - Plan: Lipid panel  Hypertriglyceridemia - Plan: Lipid panel I encouraged him to continue to take good care of himself.  He will continue to be followed by psychiatry.

## 2017-10-08 ENCOUNTER — Telehealth: Payer: Self-pay

## 2017-10-08 NOTE — Telephone Encounter (Signed)
Tell him my mistake but I forgot to put a note in that I wanted that repeated.  Schedule him for blood work for cmet

## 2017-10-08 NOTE — Telephone Encounter (Signed)
Patient called in, and left a voicemail questioning his last CMP results of his AST, and ALT results. They were way higher than previous. He does state he was nonfasting; but would like your opinion on if this is something to be concerned about.  He did say you could let him know through mychart.

## 2017-10-09 ENCOUNTER — Other Ambulatory Visit: Payer: Self-pay

## 2017-10-09 DIAGNOSIS — E781 Pure hyperglyceridemia: Secondary | ICD-10-CM

## 2017-10-09 NOTE — Progress Notes (Signed)
Placed lab work. Notified patient via mychart.

## 2017-10-10 NOTE — Telephone Encounter (Signed)
Pt aware- scheduled for 10/25/17. Victorino December

## 2017-10-24 ENCOUNTER — Other Ambulatory Visit: Payer: PRIVATE HEALTH INSURANCE

## 2017-10-24 DIAGNOSIS — E781 Pure hyperglyceridemia: Secondary | ICD-10-CM

## 2017-10-25 ENCOUNTER — Telehealth: Payer: Self-pay | Admitting: Family Medicine

## 2017-10-25 ENCOUNTER — Other Ambulatory Visit: Payer: PRIVATE HEALTH INSURANCE

## 2017-10-25 LAB — COMPREHENSIVE METABOLIC PANEL
AG RATIO: 1.9 (calc) (ref 1.0–2.5)
ALBUMIN MSPROF: 4.6 g/dL (ref 3.6–5.1)
ALKALINE PHOSPHATASE (APISO): 73 U/L (ref 40–115)
ALT: 19 U/L (ref 9–46)
AST: 17 U/L (ref 10–35)
BILIRUBIN TOTAL: 0.6 mg/dL (ref 0.2–1.2)
BUN: 15 mg/dL (ref 7–25)
CALCIUM: 9.5 mg/dL (ref 8.6–10.3)
CHLORIDE: 102 mmol/L (ref 98–110)
CO2: 29 mmol/L (ref 20–32)
Creat: 0.99 mg/dL (ref 0.70–1.33)
Globulin: 2.4 g/dL (calc) (ref 1.9–3.7)
Glucose, Bld: 88 mg/dL (ref 65–99)
POTASSIUM: 3.8 mmol/L (ref 3.5–5.3)
SODIUM: 138 mmol/L (ref 135–146)
TOTAL PROTEIN: 7 g/dL (ref 6.1–8.1)

## 2017-10-25 NOTE — Telephone Encounter (Signed)
Answered pt's questions and he verbalized understanding. Victorino December

## 2017-10-25 NOTE — Telephone Encounter (Signed)
Pt called to get lab results and has concerns about labs

## 2017-11-20 ENCOUNTER — Encounter: Payer: Self-pay | Admitting: Allergy and Immunology

## 2017-11-20 ENCOUNTER — Ambulatory Visit (INDEPENDENT_AMBULATORY_CARE_PROVIDER_SITE_OTHER): Payer: PRIVATE HEALTH INSURANCE | Admitting: Allergy and Immunology

## 2017-11-20 VITALS — BP 110/78 | HR 67 | Resp 17 | Ht 68.0 in | Wt 176.0 lb

## 2017-11-20 DIAGNOSIS — J3089 Other allergic rhinitis: Secondary | ICD-10-CM | POA: Diagnosis not present

## 2017-11-20 DIAGNOSIS — K219 Gastro-esophageal reflux disease without esophagitis: Secondary | ICD-10-CM

## 2017-11-20 MED ORDER — DEXLANSOPRAZOLE 60 MG PO CPDR: 60.0000 mg | DELAYED_RELEASE_CAPSULE | Freq: Every day | ORAL | 5 refills | Status: DC

## 2017-11-20 NOTE — Patient Instructions (Addendum)
  1.  Allergen avoidance measures  2.  Replace throat clearing with swallowing maneuver  3.  Treat reflux:   A.  Consolidate all caffeine and chocolate consumption  B.  Minimize alcohol consumption  C.  Dexilant 60 mg 1 tablet 1 time per day.  Coupon.  4.  If needed:   A.  OTC antihistamine  B.  Nasal saline  5.  Return to clinic in 4 weeks or earlier if problem

## 2017-11-20 NOTE — Progress Notes (Signed)
Dear Dr. Redmond School,  Thank you for referring Daniel Cherry to the [facility] on 11/20/2017.   Below is a summation of this patient's evaluation and recommendations.  Thank you for your referral. I will keep you informed about this patient's response to treatment.   If you have any questions please do not hesitate to contact me.   Sincerely,  Jiles Prows, MD Allergy / Immunology [place]   ______________________________________________________________________    NEW PATIENT NOTE  Referring Provider: Denita Lung, MD Primary Provider: Denita Lung, MD Date of office visit: 11/20/2017    Subjective:   Chief Complaint:  Daniel Cherry (DOB: [birth year]) is a 52 y.o. male who presents to the clinic on 11/20/2017 with a chief complaint of Other (Pt states he has had chronic congestion. It gets worse after meals. Pt feels that he constantly has to clear his throat. ) .     HPI: [name] presents to this clinic in evaluation of possible allergies.  He has a decade or greater history of having issues with throat clearing and coating in his throat without any raspy voice and without any throat pain that appears to be a progressive issue for the past several years.  There is no obvious provoking factor giving rise to this issue other than the act of eating.  It appears as though this issue becomes much more prevalent after completing a meal.  He has tried various antihistamines to treat this issue which have not helped.  He does have issues with burping and belching especially at nighttime.  He does not treat reflux.  He drinks 1 caffeinated soda per day and has chocolate once a week and drinks ethanol 1 time per week.  He does have an issue occasionally with some nasal congestion but does not have a significant amount of issues with sneezing or runny nose or itching of issues with his eyes.   He can smell and taste without any difficulty and is not plagued with headaches.  He has no other atopic symptomatology.  Past Medical History:  Diagnosis Date  . Anxiety   . Depression     Past Surgical History:  Procedure Laterality Date  . DERMABRASION  1986    Allergies as of 11/20/2017   No Known Allergies     Medication List      finasteride 5 MG tablet Commonly known as:  PROSCAR TAKE 1 TABLET BY MOUTH EVERY DAY   mirtazapine 30 MG tablet Commonly known as:  REMERON Take 1 tablet (30 mg total) by mouth 2 (two) times daily.   TRINTELLIX 20 MG Tabs Generic drug:  vortioxetine HBr TAKE 1 TABLET BY MOUTH EVERY DAY       Review of systems negative except as noted in HPI / PMHx or noted below:  Review of Systems  Constitutional: Negative.   HENT: Negative.   Eyes: Negative.   Respiratory: Negative.   Cardiovascular: Negative.   Gastrointestinal: Negative.   Genitourinary: Negative.   Musculoskeletal: Negative.   Skin: Negative.   Neurological: Negative.   Endo/Heme/Allergies: Negative.   Psychiatric/Behavioral: Negative.     Family History  Problem Relation Age of Onset  . Heart disease Father   . Hypertension Father   . Hyperlipidemia Father   . Heart disease Sister   . Hyperlipidemia Sister   . Hyperlipidemia Mother   . Dementia Brother 12  multiple illnesses  . Heart disease Brother   . Hyperlipidemia Brother   . Cancer - Prostate Maternal Uncle   . Cancer - Prostate Paternal Uncle   . Colon cancer Neg Hx   . Colon polyps Neg Hx   . Esophageal cancer Neg Hx   . Stomach cancer Neg Hx   . Rectal cancer Neg Hx   . Allergic rhinitis Neg Hx   . Angioedema Neg Hx   . Asthma Neg Hx   . Atopy Neg Hx   . Eczema Neg Hx   . Immunodeficiency Neg Hx   . Urticaria Neg Hx     Social History   Socioeconomic History  . Marital status: Single    Spouse name: Not on file  . Number of children: 0  . Years of education: 65  . Highest education  level: Not on file  Social Needs  . Financial resource strain: Not on file  . Food insecurity - worry: Not on file  . Food insecurity - inability: Not on file  . Transportation needs - medical: Not on file  . Transportation needs - non-medical: Not on file  Occupational History    Comment: SW, [employer]  Tobacco Use  . Smoking status: Never Smoker  . Smokeless tobacco: Never Used  Substance and Sexual Activity  . Alcohol use: Yes    Alcohol/week: 0.6 oz    Types: 1 Glasses of wine per week    Comment: 2-3 monthly  . Drug use: No  . Sexual activity: Yes  Other Topics Concern  . Not on file  Social History Narrative   Lives with partner   Caffeine- sodas, 1-2 daily    Environmental and Social history  Lives in a house with a dry environment, no animals located inside the household, carpet in the bedroom, no plastic on the bed, no plastic on the pillow, and no smokers located inside the household.  He works in an office setting.  Objective:   Vitals:   11/20/17 0824  BP: 110/78  Pulse: 67  Resp: 17  SpO2: 98%   Height: 5\' 8"  (172.7 cm) Weight: 176 lb (79.8 kg)  Physical Exam  Constitutional: He is well-developed, well-nourished, and in no distress.  Slight throat clearing  HENT:  Head: Normocephalic. Head is without right periorbital erythema and without left periorbital erythema.  Right Ear: Tympanic membrane, external ear and ear canal normal.  Left Ear: Tympanic membrane, external ear and ear canal normal.  Nose: Nose normal. No mucosal edema or rhinorrhea.  Mouth/Throat: Oropharynx is clear and moist and mucous membranes are normal. No oropharyngeal exudate.  Eyes: Conjunctivae and lids are normal. Pupils are equal, round, and reactive to light.  Neck: Trachea normal. No tracheal deviation present. No thyromegaly present.  Cardiovascular: Normal rate, regular rhythm, S1 normal, S2 normal and normal heart sounds.  No murmur heard. Pulmonary/Chest: Effort  normal. No stridor. No tachypnea. No respiratory distress. He has no wheezes. He has no rales. He exhibits no tenderness.  Abdominal: Soft. He exhibits no distension and no mass. There is no hepatosplenomegaly. There is no tenderness. There is no rebound and no guarding.  Musculoskeletal: He exhibits no edema or tenderness.  Lymphadenopathy:       Head (right side): No tonsillar adenopathy present.       Head (left side): No tonsillar adenopathy present.    He has no cervical adenopathy.    He has no axillary adenopathy.  Neurological: He is alert. Gait  normal.  Skin: No rash noted. He is not diaphoretic. No erythema. No pallor. Nails show no clubbing.  Psychiatric: Mood and affect normal.    Diagnostics: Allergy skin tests were performed. He demonstrated hypersensitivity against molds and tree pollen.  Assessment and Plan:    1. LPRD (laryngopharyngeal reflux disease)   2. Other allergic rhinitis     1.  Allergen avoidance measures  2.  Replace throat clearing with swallowing maneuver  3.  Treat reflux:   A.  Consolidate all caffeine and chocolate consumption  B.  Minimize alcohol consumption  C.  Dexilant 60 mg 1 tablet 1 time per day.  Coupon.  4.  If needed:   A.  OTC antihistamine  B.  Nasal saline  5.  Return to clinic in 4 weeks or earlier if problem  Davante may have a component of atopic disease contributing to his respiratory tract symptoms but his history is also very consistent with LPR and I am going to have him treat this issue with the plan noted above over the course of the next 4 weeks.  I will regroup with him at that point in time and consider further evaluation and treatment based upon his response to this approach.  Jiles Prows, MD Allergy / Immunology [facility] [place]

## 2017-11-21 ENCOUNTER — Encounter: Payer: Self-pay | Admitting: Allergy and Immunology

## 2017-12-04 ENCOUNTER — Telehealth: Payer: Self-pay | Admitting: Allergy and Immunology

## 2017-12-04 NOTE — Telephone Encounter (Signed)
Pt came in and you put him on Dexilant 60 and he is having side affect from it. He is having jaw pain. [facility] [phone].

## 2017-12-04 NOTE — Telephone Encounter (Signed)
Spoke with Pt, Pt states he has had some stiffness and slight popping in jaw. Pt advised that jaw pain/ stiffness has no indication as a side effect to Dexilant. Pt states he will follow up with Dentist.

## 2017-12-09 ENCOUNTER — Other Ambulatory Visit: Payer: Self-pay | Admitting: Family Medicine

## 2017-12-09 MED ORDER — OSELTAMIVIR PHOSPHATE 75 MG PO CAPS: 75.0000 mg | ORAL_CAPSULE | Freq: Every day | ORAL | 0 refills | Status: DC

## 2017-12-09 NOTE — Progress Notes (Signed)
A household member was diagnosed with the flu and I will therefore call in Tamiflu

## 2017-12-25 ENCOUNTER — Ambulatory Visit (INDEPENDENT_AMBULATORY_CARE_PROVIDER_SITE_OTHER): Payer: PRIVATE HEALTH INSURANCE | Admitting: Allergy and Immunology

## 2017-12-25 ENCOUNTER — Encounter: Payer: Self-pay | Admitting: Allergy and Immunology

## 2017-12-25 VITALS — BP 106/70 | HR 68 | Resp 16

## 2017-12-25 DIAGNOSIS — K219 Gastro-esophageal reflux disease without esophagitis: Secondary | ICD-10-CM

## 2017-12-25 DIAGNOSIS — J3089 Other allergic rhinitis: Secondary | ICD-10-CM | POA: Diagnosis not present

## 2017-12-25 MED ORDER — RANITIDINE HCL 300 MG PO TABS: 300.0000 mg | ORAL_TABLET | Freq: Every day | ORAL | 5 refills | Status: DC

## 2017-12-25 MED ORDER — METHYLPREDNISOLONE ACETATE 80 MG/ML IJ SUSP
80.0000 mg | Freq: Once | INTRAMUSCULAR | Status: AC
Start: 2017-12-25 — End: 2017-12-25
  Administered 2017-12-25: 80 mg via INTRAMUSCULAR

## 2017-12-25 NOTE — Patient Instructions (Addendum)
  1.  Allergen avoidance measures  2.  Replace throat clearing with swallowing maneuver  3.  Continue to Treat reflux:   A.  Caffeine, chocolate, alcohol  B.  Start ranitidine 300mg  in PM  C.  Dexilant 60 mg in AM  4. treat inflammation:   A. OTC Nasacort one spray each nostril one time per day  B.  Depo-Medrol 80 IM delivered in clinic today  5.  If needed:   A.  OTC antihistamine  B.  Nasal saline  6.  Return to clinic in 4 weeks or earlier if problem

## 2017-12-25 NOTE — Progress Notes (Signed)
Follow-up Note  Referring Provider: Denita Lung, MD Primary Provider: Denita Lung, MD Date of Office Visit: 12/25/2017  Subjective:   Daniel Cherry (DOB: 04-Oct-1966) is a 52 y.o. male who returns to the [facility] on 12/25/2017 in re-evaluation of the following:  HPI: Daniel Cherry returns to this clinic in reevaluation of his allergic rhinitis and LPR addressed during his initial evaluation 20 November 2017.  He noticed rather dramatic improvement regarding almost all of his throat and airway issues with initial therapy but over the course of the past week or little bit longer he has had return of congestion and has had drainage and some throat clearing.  He has been very good about attempting to eliminate consumption of caffeine and chocolate and alcohol and currently is consuming caffeinated drinks less than once a day and chocolate once a month and alcohol about every 2 weeks.  He continues to use Dexilant and Zyrtec in combination.  Allergies as of 12/25/2017   No Known Allergies     Medication List      dexlansoprazole 60 MG capsule Commonly known as:  DEXILANT Take 1 capsule (60 mg total) by mouth daily.   finasteride 5 MG tablet Commonly known as:  PROSCAR TAKE 1 TABLET BY MOUTH EVERY DAY   traZODone 50 MG tablet Commonly known as:  DESYREL TAKE 1/2 - 1 TAB ABOUT 1 HOUR BEFORE BED. MAY INCREASE IF NECESSARY IN STEPS OF 25MG  UP TO 2 TABS   TRINTELLIX 20 MG Tabs tablet Generic drug:  vortioxetine HBr TAKE 1 TABLET BY MOUTH EVERY DAY       Past Medical History:  Diagnosis Date  . Anxiety   . Depression     Past Surgical History:  Procedure Laterality Date  . DERMABRASION  1986    Review of systems negative except as noted in HPI / PMHx or noted below:  Review of Systems  Constitutional: Negative.   HENT: Negative.   Eyes: Negative.   Respiratory: Negative.   Cardiovascular: Negative.   Gastrointestinal: Negative.   Genitourinary:  Negative.   Musculoskeletal: Negative.   Skin: Negative.   Neurological: Negative.   Endo/Heme/Allergies: Negative.   Psychiatric/Behavioral: Negative.      Objective:   Vitals:   12/25/17 1023  BP: 106/70  Pulse: 68  Resp: 16          Physical Exam  Constitutional: He is well-developed, well-nourished, and in no distress.  Slightly nasal voice  HENT:  Head: Normocephalic.  Right Ear: Tympanic membrane, external ear and ear canal normal.  Left Ear: Tympanic membrane, external ear and ear canal normal.  Nose: Mucosal edema present. No rhinorrhea.  Mouth/Throat: Uvula is midline, oropharynx is clear and moist and mucous membranes are normal. No oropharyngeal exudate.  Eyes: Conjunctivae are normal.  Neck: Trachea normal. No tracheal tenderness present. No tracheal deviation present. No thyromegaly present.  Cardiovascular: Normal rate, regular rhythm, S1 normal, S2 normal and normal heart sounds.  No murmur heard. Pulmonary/Chest: Breath sounds normal. No stridor. No respiratory distress. He has no wheezes. He has no rales.  Musculoskeletal: He exhibits no edema.  Lymphadenopathy:       Head (right side): No tonsillar adenopathy present.       Head (left side): No tonsillar adenopathy present.    He has no cervical adenopathy.  Neurological: He is alert. Gait normal.  Skin: No rash noted. He is not diaphoretic. No erythema. Nails show no clubbing.  Psychiatric: Mood and affect normal.    Diagnostics: none  Assessment and Plan:   1. Other allergic rhinitis   2. LPRD (laryngopharyngeal reflux disease)     1.  Allergen avoidance measures  2.  Replace throat clearing with swallowing maneuver  3.  Continue to Treat reflux:   A.  Caffeine, chocolate, alcohol  B.  Start ranitidine 300mg  in PM  C.  Dexilant 60 mg in AM  4. treat inflammation:   A. OTC Nasacort one spray each nostril one time per day  B.  Depo-Medrol 80 IM delivered in clinic today  5.  If  needed:   A.  OTC antihistamine  B.  Nasal saline  6.  Return to clinic in 4 weeks or earlier if problem  Daniel Cherry will now utilize anti-inflammatory agents for his respiratory tract in the form of Nasacort and I have given him an injection of a systemic steroid.  In addition, we will have him use 2 medicines for his LPR.  I will regroup with him in 4 weeks to make a determination about how to proceed.  [name], MD Allergy / Immunology [place]

## 2017-12-26 ENCOUNTER — Encounter: Payer: Self-pay | Admitting: Allergy and Immunology

## 2018-01-02 ENCOUNTER — Ambulatory Visit: Payer: PRIVATE HEALTH INSURANCE | Admitting: Family Medicine

## 2018-01-22 ENCOUNTER — Encounter: Payer: Self-pay | Admitting: Allergy and Immunology

## 2018-01-22 ENCOUNTER — Ambulatory Visit (INDEPENDENT_AMBULATORY_CARE_PROVIDER_SITE_OTHER): Payer: PRIVATE HEALTH INSURANCE | Admitting: Allergy and Immunology

## 2018-01-22 VITALS — BP 134/88 | HR 84 | Resp 16

## 2018-01-22 DIAGNOSIS — J3089 Other allergic rhinitis: Secondary | ICD-10-CM

## 2018-01-22 DIAGNOSIS — K219 Gastro-esophageal reflux disease without esophagitis: Secondary | ICD-10-CM

## 2018-01-22 DIAGNOSIS — K120 Recurrent oral aphthae: Secondary | ICD-10-CM

## 2018-01-22 NOTE — Progress Notes (Signed)
Follow-up Note  Referring Provider: Denita Lung, MD Primary Provider: Denita Lung, MD Date of Office Visit: 01/22/2018  Subjective:   Daniel Cherry (DOB: [birth year]) is a 52 y.o. male who returns to the [facility] [place] on 01/22/2018 in re-evaluation of the following:  HPI: Daniel Cherry returns to this clinic in reevaluation of allergic rhinitis and LPR.  His last visit to this clinic was 25 December 2017  at which point in time he still appeared to have some issues with reflux for which we introduced ranitidine to his Dexilant and he continued on a nasal steroid.  Unfortunately a few days after his last visit he contracted a head cold that did not improved in 2 weeks and subsequently required a course of antibiotics for sinusitis.  He is much better at this point time regarding his head but still has some drainage in his throat.  Overall his reflux is under excellent control at this point in time and he feels as though he is on the right track regarding his allergic rhinitis and LPR on his current therapy.  Allergies as of 01/22/2018   No Known Allergies     Medication List      dexlansoprazole 60 MG capsule Commonly known as:  DEXILANT Take 1 capsule (60 mg total) by mouth daily.   finasteride 5 MG tablet Commonly known as:  PROSCAR TAKE 1 TABLET BY MOUTH EVERY DAY   ranitidine 300 MG tablet Commonly known as:  ZANTAC Take 1 tablet (300 mg total) by mouth at bedtime.   traZODone 50 MG tablet Commonly known as:  DESYREL TAKE 1/2 - 1 TAB ABOUT 1 HOUR BEFORE BED. MAY INCREASE IF NECESSARY IN STEPS OF 25MG  UP TO 2 TABS   TRINTELLIX 20 MG Tabs tablet Generic drug:  vortioxetine HBr TAKE 1 TABLET BY MOUTH EVERY DAY       Past Medical History:  Diagnosis Date  . Anxiety   . Depression     Past Surgical History:  Procedure Laterality Date  . DERMABRASION  1986    Review of systems negative except as noted in HPI / PMHx or noted below:  Review of Systems    Constitutional: Negative.   HENT: Negative.   Eyes: Negative.   Respiratory: Negative.   Cardiovascular: Negative.   Gastrointestinal: Negative.   Genitourinary: Negative.   Musculoskeletal: Negative.   Skin: Negative.   Neurological: Negative.   Endo/Heme/Allergies: Negative.   Psychiatric/Behavioral: Negative.      Objective:   Vitals:   01/22/18 1634  BP: 134/88  Pulse: 84  Resp: 16          Physical Exam  Constitutional: He is well-developed, well-nourished, and in no distress.  HENT:  Head: Normocephalic.  Right Ear: Tympanic membrane, external ear and ear canal normal.  Left Ear: Tympanic membrane, external ear and ear canal normal.  Nose: Nose normal. No mucosal edema or rhinorrhea.  Mouth/Throat: Uvula is midline, oropharynx is clear and moist and mucous membranes are normal. Oral lesions (0.5 cm aphthous ulcer left soft palate) present. No oropharyngeal exudate.  Eyes: Conjunctivae are normal.  Neck: Trachea normal. No tracheal tenderness present. No tracheal deviation present. No thyromegaly present.  Cardiovascular: Normal rate, regular rhythm, S1 normal, S2 normal and normal heart sounds.  No murmur heard. Pulmonary/Chest: Breath sounds normal. No stridor. No respiratory distress. He has no wheezes. He has no rales.  Musculoskeletal: He exhibits no edema.  Lymphadenopathy:  Head (right side): No tonsillar adenopathy present.       Head (left side): No tonsillar adenopathy present.    He has no cervical adenopathy.  Neurological: He is alert. Gait normal.  Skin: No rash noted. He is not diaphoretic. No erythema. Nails show no clubbing.  Psychiatric: Mood and affect normal.    Diagnostics: none  Assessment and Plan:   1. Other allergic rhinitis   2. LPRD (laryngopharyngeal reflux disease)   3. Aphthous ulcer of mouth     1.  Perform Allergen avoidance measures as best as possible  2.  Replace throat clearing with swallowing maneuver  3.   Continue to Treat reflux:   A.  Caffeine, chocolate, alcohol  B.  Ranitidine 300mg  in PM  C.  Dexilant 60 mg in AM  4. treat inflammation:   A. OTC Nasacort one spray each nostril one time per day  5.  If needed:   A.  OTC antihistamine  B.  Nasal saline  6.  Return to clinic in 6 months or earlier if problem  Daniel Cherry appears to be doing relatively well and we will keep him on aggressive therapy directed against reflux and therapy directed against atopic respiratory disease as noted above.  He does appear to have an aphthous ulcer of his left soft palate but this is not bothering him at all and has been present now for about 4 days and he really does not require any therapy.  He will contact me should he have significant problems in the face of our therapy but otherwise I will see him back in this clinic in 6 months or earlier if there is a problem.  [name], MD Allergy / Immunology [place]

## 2018-01-22 NOTE — Patient Instructions (Signed)
  1.  Perform Allergen avoidance measures as best as possible  2.  Replace throat clearing with swallowing maneuver  3.  Continue to Treat reflux:   A.  Caffeine, chocolate, alcohol  B.  Ranitidine 300mg  in PM  C.  Dexilant 60 mg in AM  4. treat inflammation:   A. OTC Nasacort one spray each nostril one time per day  5.  If needed:   A.  OTC antihistamine  B.  Nasal saline  6.  Return to clinic in 6 months or earlier if problem

## 2018-01-23 ENCOUNTER — Encounter: Payer: Self-pay | Admitting: Allergy and Immunology

## 2018-04-23 ENCOUNTER — Ambulatory Visit (INDEPENDENT_AMBULATORY_CARE_PROVIDER_SITE_OTHER): Payer: PRIVATE HEALTH INSURANCE | Admitting: Allergy and Immunology

## 2018-04-23 ENCOUNTER — Encounter: Payer: Self-pay | Admitting: Allergy and Immunology

## 2018-04-23 VITALS — BP 128/72 | HR 75 | Resp 17

## 2018-04-23 DIAGNOSIS — K219 Gastro-esophageal reflux disease without esophagitis: Secondary | ICD-10-CM | POA: Diagnosis not present

## 2018-04-23 DIAGNOSIS — J3089 Other allergic rhinitis: Secondary | ICD-10-CM | POA: Diagnosis not present

## 2018-04-23 MED ORDER — AZELASTINE-FLUTICASONE 137-50 MCG/ACT NA SUSP: 1.0000 | Freq: Every day | NASAL | 5 refills | Status: DC | PRN

## 2018-04-23 MED ORDER — DEXLANSOPRAZOLE 60 MG PO CPDR: 60.0000 mg | DELAYED_RELEASE_CAPSULE | Freq: Every day | ORAL | 5 refills | Status: DC

## 2018-04-23 MED ORDER — CARBINOXAMINE MALEATE 6 MG PO TABS: 1.0000 | ORAL_TABLET | Freq: Two times a day (BID) | ORAL | 5 refills | Status: DC | PRN

## 2018-04-23 MED ORDER — RANITIDINE HCL 300 MG PO TABS: 300.0000 mg | ORAL_TABLET | Freq: Every day | ORAL | 5 refills | Status: DC

## 2018-04-23 NOTE — Patient Instructions (Addendum)
  1.  Perform Allergen avoidance measures as best as possible  2.  Replace throat clearing with swallowing maneuver  3.  Continue to Treat reflux:   A.  Caffeine, chocolate, alcohol  B.  Ranitidine 300mg  in PM  C.  Dexilant 60 mg in AM  4. Treat inflammation:   A. Dymista - one spray each nostril 2 times per day  5.  If needed:   A.  OTC antihistamine  B.  Nasal saline  C. Ryvent - one tablet two times per day  6.  Visit with ENT to examine throat  7. Return in 8 weeks or earlier if problem

## 2018-04-23 NOTE — Progress Notes (Signed)
Follow-up Note  Referring Provider: Denita Lung, MD Primary Provider: Denita Lung, MD Date of Office Visit: 04/23/2018  Subjective:   Daniel Cherry (DOB: Sep 27, 1966) is a 52 y.o. male who returns to the [facility] on 04/23/2018 in re-evaluation of the following:  HPI: Antuan returns to this clinic in reevaluation of allergic rhinitis and LPR.  His last visit to this clinic was 22 January 2018.  He still continues to have postnasal drip and throat clearing and feeling as though there is mucus running down his throat.  He does not really have a tremendous amount of upper airway congestion.  He has been very good about treating his reflux and has a caffeinated drink just a few times a week and he has stopped his nasal steroid because he felt it did not really help him very much.  Presently he is using a combination of Claritin in the morning and Allegra in the evening.  Allergies as of 04/23/2018   No Known Allergies     Medication List      dexlansoprazole 60 MG capsule Commonly known as:  DEXILANT Take 1 capsule (60 mg total) by mouth daily.   fexofenadine 180 MG tablet Commonly known as:  ALLEGRA Take 180 mg by mouth every evening.   finasteride 5 MG tablet Commonly known as:  PROSCAR TAKE 1 TABLET BY MOUTH EVERY DAY   loratadine 10 MG tablet Commonly known as:  CLARITIN Take 10 mg by mouth daily.   ranitidine 300 MG tablet Commonly known as:  ZANTAC Take 1 tablet (300 mg total) by mouth at bedtime.   traZODone 50 MG tablet Commonly known as:  DESYREL TAKE 1/2 - 1 TAB ABOUT 1 HOUR BEFORE BED. MAY INCREASE IF NECESSARY IN STEPS OF 25MG  UP TO 2 TABS   TRINTELLIX 20 MG Tabs tablet Generic drug:  vortioxetine HBr TAKE 1 TABLET BY MOUTH EVERY DAY       Past Medical History:  Diagnosis Date  . Anxiety   . Depression     Past Surgical History:  Procedure Laterality Date  . DERMABRASION  1986    Review of systems negative except as noted  in HPI / PMHx or noted below:  Review of Systems  Constitutional: Negative.   HENT: Negative.   Eyes: Negative.   Respiratory: Negative.   Cardiovascular: Negative.   Gastrointestinal: Negative.   Genitourinary: Negative.   Musculoskeletal: Negative.   Skin: Negative.   Neurological: Negative.   Endo/Heme/Allergies: Negative.   Psychiatric/Behavioral: Negative.      Objective:   Vitals:   04/23/18 1627  BP: 128/72  Pulse: 75  Resp: 17  SpO2: 95%          Physical Exam  HENT:  Head: Normocephalic.  Right Ear: Tympanic membrane, external ear and ear canal normal.  Left Ear: Tympanic membrane, external ear and ear canal normal.  Nose: Nose normal. No mucosal edema or rhinorrhea.  Mouth/Throat: Uvula is midline, oropharynx is clear and moist and mucous membranes are normal. No oropharyngeal exudate.  Eyes: Conjunctivae are normal.  Neck: Trachea normal. No tracheal tenderness present. No tracheal deviation present. No thyromegaly present.  Cardiovascular: Normal rate, regular rhythm, S1 normal, S2 normal and normal heart sounds.  No murmur heard. Pulmonary/Chest: Breath sounds normal. No stridor. No respiratory distress. He has no wheezes. He has no rales.  Musculoskeletal: He exhibits no edema.  Lymphadenopathy:       Head (right side): No  tonsillar adenopathy present.       Head (left side): No tonsillar adenopathy present.    He has no cervical adenopathy.  Neurological: He is alert.  Skin: No rash noted. He is not diaphoretic. No erythema. Nails show no clubbing.    Diagnostics: none  Assessment and Plan:   1. Other allergic rhinitis   2. LPRD (laryngopharyngeal reflux disease)     1.  Perform Allergen avoidance measures as best as possible  2.  Replace throat clearing with swallowing maneuver  3.  Continue to Treat reflux:   A.  Caffeine, chocolate, alcohol  B.  Ranitidine 300mg  in PM  C.  Dexilant 60 mg in AM  4. Treat inflammation:   A.  Dymista - one spray each nostril 2 times per day  5.  If needed:   A.  OTC antihistamine  B.  Nasal saline  C.  Ryvent - one tablet two times per day  6.  Visit with ENT to examine throat  7. Return in 8 weeks or earlier if problem  I will have Hilliard Clark evaluated by ENT to make sure were not dealing with some other significant abnormality giving rise to his throat symptoms other than LPR and he will continue to utilize therapy directed against LPR.  As well, I will have him use a nasal antihistamine along with a nasal steroid and he has the option of using carbinoxamine.  We will see how things go over the course of the next 8 weeks.  [name], MD Allergy / Immunology [place]

## 2018-04-24 ENCOUNTER — Encounter: Payer: Self-pay | Admitting: Allergy and Immunology

## 2018-06-05 ENCOUNTER — Encounter: Payer: Self-pay | Admitting: Family Medicine

## 2018-06-05 ENCOUNTER — Ambulatory Visit (INDEPENDENT_AMBULATORY_CARE_PROVIDER_SITE_OTHER): Payer: PRIVATE HEALTH INSURANCE | Admitting: Family Medicine

## 2018-06-05 VITALS — BP 106/70 | HR 68 | Temp 98.1°F | Ht 68.0 in | Wt 176.0 lb

## 2018-06-05 DIAGNOSIS — F419 Anxiety disorder, unspecified: Secondary | ICD-10-CM | POA: Diagnosis not present

## 2018-06-05 DIAGNOSIS — J3089 Other allergic rhinitis: Secondary | ICD-10-CM | POA: Insufficient documentation

## 2018-06-05 DIAGNOSIS — E785 Hyperlipidemia, unspecified: Secondary | ICD-10-CM

## 2018-06-05 DIAGNOSIS — N400 Enlarged prostate without lower urinary tract symptoms: Secondary | ICD-10-CM

## 2018-06-05 DIAGNOSIS — E781 Pure hyperglyceridemia: Secondary | ICD-10-CM

## 2018-06-05 DIAGNOSIS — D126 Benign neoplasm of colon, unspecified: Secondary | ICD-10-CM

## 2018-06-05 DIAGNOSIS — Z8249 Family history of ischemic heart disease and other diseases of the circulatory system: Secondary | ICD-10-CM

## 2018-06-05 DIAGNOSIS — J309 Allergic rhinitis, unspecified: Secondary | ICD-10-CM

## 2018-06-05 DIAGNOSIS — B351 Tinea unguium: Secondary | ICD-10-CM

## 2018-06-05 DIAGNOSIS — Z79899 Other long term (current) drug therapy: Secondary | ICD-10-CM

## 2018-06-05 MED ORDER — FINASTERIDE 5 MG PO TABS: 5.0000 mg | ORAL_TABLET | Freq: Every day | ORAL | 3 refills | Status: DC

## 2018-06-05 NOTE — Progress Notes (Signed)
   Subjective:    Patient ID: Daniel Cherry, male    DOB: 1966-03-15, 52 y.o.   MRN: [id number]  HPI He is here for an interval evaluation.  He is being seen for his allergies by Dr. Carmelina Peal and there is concern about allergy versus reflux.  He is on antihistamines as well as PPI and H2 blocker.  He also continues on pulse dosing of itraconazole for onychomycosis.  He is doing well on his psychotropic medications and is seeing Dr. Albertine Patricia for this.  There is a family history of heart disease.  He continues to do well on his finasteride for treatment of BPH.  He has had a colonoscopy and it did show adenomatous colonic polyp.  He is on a 5-year schedule.   Review of Systems     Objective:   Physical Exam Alert and in no distress. Tympanic membranes and canals are normal. Pharyngeal area is normal. Neck is supple without adenopathy or thyromegaly. Cardiac exam shows a regular sinus rhythm without murmurs or gallops. Lungs are clear to auscultation.  Abdominal exam shows no masses or tenderness with normal bowel sounds        Assessment & Plan:  Anxiety disorder, unspecified type  Hypertriglyceridemia - Plan: Lipid panel  Benign prostatic hyperplasia with nocturia  Hyperlipidemia LDL goal <100 - Plan: Lipid panel  Family history of heart disease in male family member before age 27  Onychomycosis - Plan: Comprehensive metabolic panel  Encounter for long-term (current) use of medications - Plan: Comprehensive metabolic panel  Adenomatous polyp of colon, unspecified part of colon  BPH (benign prostatic hyperplasia) - Plan: finasteride (PROSCAR) 5 MG tablet  Allergic rhinitis, unspecified seasonality, unspecified trigger  Overall he seems to be doing quite nicely.  I will continue him on the finasteride.  He will continue to be followed by Dr. Albertine Patricia and Dr. Carmelina Peal.  He will continue on pulse dosing of the fluconazole.

## 2018-06-06 LAB — COMPREHENSIVE METABOLIC PANEL
A/G RATIO: 1.8 (ref 1.2–2.2)
ALBUMIN: 4.6 g/dL (ref 3.5–5.5)
ALT: 17 IU/L (ref 0–44)
AST: 19 IU/L (ref 0–40)
Alkaline Phosphatase: 81 IU/L (ref 39–117)
BUN/Creatinine Ratio: 15 (ref 9–20)
BUN: 14 mg/dL (ref 6–24)
Bilirubin Total: 0.3 mg/dL (ref 0.0–1.2)
CO2: 24 mmol/L (ref 20–29)
Calcium: 9.4 mg/dL (ref 8.7–10.2)
Chloride: 102 mmol/L (ref 96–106)
Creatinine, Ser: 0.95 mg/dL (ref 0.76–1.27)
GFR calc non Af Amer: 91 mL/min/{1.73_m2} (ref >59)
GFR, EST AFRICAN AMERICAN: 105 mL/min/{1.73_m2} (ref >59)
GLOBULIN, TOTAL: 2.6 g/dL (ref 1.5–4.5)
Glucose: 86 mg/dL (ref 65–99)
POTASSIUM: 4.3 mmol/L (ref 3.5–5.2)
SODIUM: 139 mmol/L (ref 134–144)
TOTAL PROTEIN: 7.2 g/dL (ref 6.0–8.5)

## 2018-06-06 LAB — LIPID PANEL
CHOL/HDL RATIO: 5.9 ratio — AB (ref 0.0–5.0)
Cholesterol, Total: 212 mg/dL — ABNORMAL HIGH (ref 100–199)
HDL: 36 mg/dL — ABNORMAL LOW (ref >39)
LDL Calculated: 136 mg/dL — ABNORMAL HIGH (ref 0–99)
Triglycerides: 201 mg/dL — ABNORMAL HIGH (ref 0–149)
VLDL Cholesterol Cal: 40 mg/dL (ref 5–40)

## 2018-06-06 MED ORDER — ATORVASTATIN CALCIUM 20 MG PO TABS: 20.0000 mg | ORAL_TABLET | Freq: Every day | ORAL | 3 refills | Status: DC

## 2018-06-06 NOTE — Addendum Note (Signed)
Addended by: Denita Lung on: 06/06/2018 01:43 PM   Modules accepted: Orders

## 2018-06-18 ENCOUNTER — Encounter: Payer: Self-pay | Admitting: Allergy and Immunology

## 2018-06-18 ENCOUNTER — Ambulatory Visit (INDEPENDENT_AMBULATORY_CARE_PROVIDER_SITE_OTHER): Payer: PRIVATE HEALTH INSURANCE | Admitting: Allergy and Immunology

## 2018-06-18 VITALS — BP 130/84 | HR 80 | Resp 16 | Ht 68.0 in | Wt 169.0 lb

## 2018-06-18 DIAGNOSIS — K219 Gastro-esophageal reflux disease without esophagitis: Secondary | ICD-10-CM | POA: Diagnosis not present

## 2018-06-18 DIAGNOSIS — J3089 Other allergic rhinitis: Secondary | ICD-10-CM | POA: Diagnosis not present

## 2018-06-18 NOTE — Progress Notes (Signed)
Follow-up Note  Referring Provider: Denita Lung, MD Primary Provider: Denita Lung, MD Date of Office Visit: 06/18/2018  Subjective:   Daniel Cherry (DOB: 01/28/1966) is a 52 y.o. male who returns to the [facility] [place] [facility] on 06/18/2018 in re-evaluation of the following:  HPI: Daniel Cherry returns to this clinic in reevaluation of allergic rhinitis and LPR.  His last visit to this clinic was 23 April 2018 at which point in time we placed him on a combination of therapy directed against respiratory tract inflammation and therapy directed against reflux.  On his current plan he feels as though he is doing much better.  He does not feel congested in his airway and has had much less postnasal drip and overall feels very satisfied with the response he has received on his current plan.  He is treating both respiratory tract inflammation and reflux.  He did not obtain an ENT evaluation.  Allergies as of 06/18/2018   No Known Allergies     Medication List      atorvastatin 20 MG tablet Commonly known as:  LIPITOR Take 1 tablet (20 mg total) by mouth daily.   Azelastine-Fluticasone 137-50 MCG/ACT Susp Place 1 spray into the nose daily as needed.   Carbinoxamine Maleate 6 MG Tabs Take 1 tablet by mouth 2 (two) times daily as needed.   dexlansoprazole 60 MG capsule Commonly known as:  DEXILANT Take 1 capsule (60 mg total) by mouth daily.   fexofenadine 180 MG tablet Commonly known as:  ALLEGRA Take 180 mg by mouth every evening.   finasteride 5 MG tablet Commonly known as:  PROSCAR Take 1 tablet (5 mg total) by mouth daily.   ITRACONAZOLE PO Take 1 capsule by mouth 2 (two) times daily.   loratadine 10 MG tablet Commonly known as:  CLARITIN Take 10 mg by mouth daily.   ranitidine 300 MG tablet Commonly known as:  ZANTAC Take 1 tablet (300 mg total) by mouth at bedtime.   traZODone 50 MG tablet Commonly known as:  DESYREL TAKE 1/2 - 1 TAB ABOUT 1 HOUR BEFORE BED.  MAY INCREASE IF NECESSARY IN STEPS OF 25MG  UP TO 2 TABS   TRINTELLIX 20 MG Tabs tablet Generic drug:  vortioxetine HBr TAKE 1 TABLET BY MOUTH EVERY DAY       Past Medical History:  Diagnosis Date  . Anxiety   . Depression     Past Surgical History:  Procedure Laterality Date  . DERMABRASION  1986    Review of systems negative except as noted in HPI / PMHx or noted below:  Review of Systems  Constitutional: Negative.   HENT: Negative.   Eyes: Negative.   Respiratory: Negative.   Cardiovascular: Negative.   Gastrointestinal: Negative.   Genitourinary: Negative.   Musculoskeletal: Negative.   Skin: Negative.   Neurological: Negative.   Endo/Heme/Allergies: Negative.   Psychiatric/Behavioral: Negative.      Objective:   Vitals:   06/18/18 1624  BP: 130/84  Pulse: 80  Resp: 16   Height: 5\' 8"  (172.7 cm)  Weight: 169 lb (76.7 kg)   Physical Exam  HENT:  Head: Normocephalic.  Right Ear: Tympanic membrane, external ear and ear canal normal.  Left Ear: Tympanic membrane, external ear and ear canal normal.  Nose: Nose normal. No mucosal edema or rhinorrhea.  Mouth/Throat: Uvula is midline, oropharynx is clear and moist and mucous membranes are normal. No oropharyngeal exudate.  Eyes: Conjunctivae are normal.  Neck:  Trachea normal. No tracheal tenderness present. No tracheal deviation present. No thyromegaly present.  Cardiovascular: Normal rate, regular rhythm, S1 normal, S2 normal and normal heart sounds.  No murmur heard. Pulmonary/Chest: Breath sounds normal. No stridor. No respiratory distress. He has no wheezes. He has no rales.  Musculoskeletal: He exhibits no edema.  Lymphadenopathy:       Head (right side): No tonsillar adenopathy present.       Head (left side): No tonsillar adenopathy present.    He has no cervical adenopathy.  Neurological: He is alert.  Skin: No rash noted. He is not diaphoretic. No erythema. Nails show no clubbing.     Diagnostics: none  Assessment and Plan:   1. Perennial allergic rhinitis   2. LPRD (laryngopharyngeal reflux disease)     1. Continue to Treat reflux:   A.  Caffeine, chocolate, alcohol  B.  Ranitidine 300mg  in PM  C.  Dexilant 60 mg in AM  2.  Continue to treat inflammation:   A. Dymista - one spray each nostril 2 times per day  3.  If needed:   A.  OTC antihistamine  B.  Nasal saline  C. Ryvent - one tablet two times per day  4.  Obtain fall flu vaccine  5.  Return to clinic January 2020 or earlier if problem  Daniel Cherry is doing relatively well at this point in time on his current plan and he will continue to utilize these medications on a consistent basis until the end of this year.  I will see him back in this clinic in January 2020 at which point time there may be a opportunity to consolidate his medical treatment.  He will contact me during the interval should there be a significant problem.  [name], MD Allergy / Immunology [place]

## 2018-06-18 NOTE — Patient Instructions (Addendum)
  1. Continue to Treat reflux:   A.  Caffeine, chocolate, alcohol  B.  Ranitidine 300mg  in PM  C.  Dexilant 60 mg in AM  2.  Continue to treat inflammation:   A. Dymista - one spray each nostril 2 times per day  3.  If needed:   A.  OTC antihistamine  B.  Nasal saline  C. Ryvent - one tablet two times per day  4.  Obtain fall flu vaccine  5.  Return to clinic January 2020 or earlier if problem

## 2018-06-19 ENCOUNTER — Encounter: Payer: Self-pay | Admitting: Allergy and Immunology

## 2018-07-05 ENCOUNTER — Telehealth: Payer: Self-pay | Admitting: *Deleted

## 2018-07-05 NOTE — Telephone Encounter (Signed)
The pharmacy reached out claiming that they needed further documentation of medical necessity for Ryvent to be covered. They did state that Loratidine and Desloratidine are less costly alternatives that are covered by his insurance. Will you please advise?

## 2018-07-08 ENCOUNTER — Other Ambulatory Visit: Payer: Self-pay | Admitting: *Deleted

## 2018-07-08 MED ORDER — CARBINOXAMINE MALEATE 6 MG PO TABS: 1.0000 | ORAL_TABLET | Freq: Two times a day (BID) | ORAL | 5 refills | Status: DC | PRN

## 2018-07-08 NOTE — Telephone Encounter (Signed)
Prescription has been resubmitted and sent to the [facility] in [place], [place]. We will follow up from there.

## 2018-07-08 NOTE — Telephone Encounter (Signed)
Failed Loratidine and Desloratidine . Needs PA for Ryvent

## 2018-07-08 NOTE — Telephone Encounter (Signed)
I am attempting to get in touch with the pharmacy so that I can receive information to initiate a PA.

## 2018-08-20 ENCOUNTER — Ambulatory Visit (INDEPENDENT_AMBULATORY_CARE_PROVIDER_SITE_OTHER): Payer: PRIVATE HEALTH INSURANCE | Admitting: Family Medicine

## 2018-08-20 VITALS — BP 120/70 | HR 88 | Temp 98.0°F | Resp 16 | Wt 167.0 lb

## 2018-08-20 DIAGNOSIS — A084 Viral intestinal infection, unspecified: Secondary | ICD-10-CM | POA: Diagnosis not present

## 2018-08-20 MED ORDER — ONDANSETRON HCL 4 MG PO TABS: ORAL_TABLET | ORAL | 0 refills | Status: DC

## 2018-08-20 NOTE — Progress Notes (Signed)
   Subjective:    Patient ID: Daniel Cherry, male    DOB: 08-09-1966, 52 y.o.   MRN: [id number]  HPI Last Friday he started having difficulty with both myalgias, headache, fatigue following the next day by worsening of that as well as a fever with some nausea but no diarrhea.  He was seen in a urgent care clinic on Sunday and treated as if he had the flu.  He did go to work yesterday but later on in the day developed fever, nausea and some diarrhea.   Review of Systems     Objective:   Physical Exam Alert and in no distress. Tympanic membranes and canals are normal. Pharyngeal area is normal. Neck is supple without adenopathy or thyromegaly. Cardiac exam shows a regular sinus rhythm without murmurs or gallops. Lungs are clear to auscultation.  Abdominal exam shows active bowel sounds without masses or tenderness.        Assessment & Plan:  Viral gastroenteritis - Plan: ondansetron (ZOFRAN) 4 MG tablet Take the Zofran as needed for the nausea and for the diarrhea you can use Imodium up to 8 pills/day.. Can use a probiotic as well.  Try Tylenol for the aches or pains and then if that does not work then use Advil or Aleve

## 2018-08-20 NOTE — Patient Instructions (Signed)
Take the Zofran as needed for the nausea and for the diarrhea you can use Imodium up to 8 pills/day.. Can use a probiotic as well.  Try Tylenol for the aches or pains and then if that does not work then use Advil or Aleve

## 2018-11-05 ENCOUNTER — Encounter: Payer: Self-pay | Admitting: Allergy and Immunology

## 2018-11-05 ENCOUNTER — Ambulatory Visit (INDEPENDENT_AMBULATORY_CARE_PROVIDER_SITE_OTHER): Payer: PRIVATE HEALTH INSURANCE | Admitting: Allergy and Immunology

## 2018-11-05 VITALS — BP 108/68 | HR 68 | Resp 18 | Ht 68.0 in | Wt 164.0 lb

## 2018-11-05 DIAGNOSIS — J3089 Other allergic rhinitis: Secondary | ICD-10-CM | POA: Diagnosis not present

## 2018-11-05 DIAGNOSIS — K219 Gastro-esophageal reflux disease without esophagitis: Secondary | ICD-10-CM | POA: Diagnosis not present

## 2018-11-05 NOTE — Progress Notes (Signed)
Follow-up Note  Referring Provider: Denita Lung, MD Primary Provider: Denita Lung, MD Date of Office Visit: 11/05/2018  Subjective:   Daniel Cherry (DOB: 08-27-1966) is a 53 y.o. male who returns to the [facility] [place] [facility] on 11/05/2018 in re-evaluation of the following:  HPI: Daniel Cherry returns to this clinic in reevaluation of allergic rhinitis and LPR.  His last visit to this clinic was 18 June 2018.  With a combination of therapy including the use of treatment directed against reflux and inflammation of his upper airway he still continues to have a "coating in his throat".  He has resolved most of his postnasal drip and his throat clearing and coughing and his nose is doing quite well without any significant congestion.  He continues on a combination of an proton pump inhibitor and an H2 receptor blocker to treat his reflux and continues on Dymista for his upper airway and RyVent on occasion.  Allergies as of 11/05/2018   No Known Allergies     Medication List      Carbinoxamine Maleate 6 MG Tabs Commonly known as:  RYVENT Take 1 tablet by mouth 2 (two) times daily as needed.   dexlansoprazole 60 MG capsule Commonly known as:  DEXILANT Take 1 capsule (60 mg total) by mouth daily.   finasteride 5 MG tablet Commonly known as:  PROSCAR Take 1 tablet (5 mg total) by mouth daily.   mirtazapine 30 MG tablet Commonly known as:  REMERON Take 60 mg by mouth at bedtime.   ranitidine 300 MG tablet Commonly known as:  ZANTAC Take 1 tablet (300 mg total) by mouth at bedtime.   traZODone 50 MG tablet Commonly known as:  DESYREL TAKE 1/2 - 1 TAB ABOUT 1 HOUR BEFORE BED. MAY INCREASE IF NECESSARY IN STEPS OF 25MG  UP TO 2 TABS   TRINTELLIX 20 MG Tabs tablet Generic drug:  vortioxetine HBr TAKE 1 TABLET BY MOUTH EVERY DAY       Past Medical History:  Diagnosis Date  . Anxiety   . Depression     Past Surgical History:  Procedure Laterality Date  .  DERMABRASION  1986    Review of systems negative except as noted in HPI / PMHx or noted below:  Review of Systems  Constitutional: Negative.   HENT: Negative.   Eyes: Negative.   Respiratory: Negative.   Cardiovascular: Negative.   Gastrointestinal: Negative.   Genitourinary: Negative.   Musculoskeletal: Negative.   Skin: Negative.   Neurological: Negative.   Endo/Heme/Allergies: Negative.   Psychiatric/Behavioral: Negative.      Objective:   Vitals:   11/05/18 1627  BP: 108/68  Pulse: 68  Resp: 18  SpO2: 98%   Height: 5\' 8"  (172.7 cm)  Weight: 164 lb (74.4 kg)   Physical Exam Constitutional:      Appearance: He is not diaphoretic.  HENT:     Head: Normocephalic.     Right Ear: Tympanic membrane, ear canal and external ear normal.     Left Ear: Tympanic membrane, ear canal and external ear normal.     Nose: Nose normal. No mucosal edema or rhinorrhea.     Mouth/Throat:     Pharynx: Uvula midline. No oropharyngeal exudate.  Eyes:     Conjunctiva/sclera: Conjunctivae normal.  Neck:     Thyroid: No thyromegaly.     Trachea: Trachea normal. No tracheal tenderness or tracheal deviation.  Cardiovascular:     Rate and Rhythm: Normal rate  and regular rhythm.     Heart sounds: Normal heart sounds, S1 normal and S2 normal. No murmur.  Pulmonary:     Effort: No respiratory distress.     Breath sounds: Normal breath sounds. No stridor. No wheezing or rales.  Lymphadenopathy:     Head:     Right side of head: No tonsillar adenopathy.     Left side of head: No tonsillar adenopathy.     Cervical: No cervical adenopathy.  Skin:    Findings: No erythema or rash.     Nails: There is no clubbing.   Neurological:     Mental Status: He is alert.     Diagnostics: none  Assessment and Plan:   1. LPRD (laryngopharyngeal reflux disease)   2. Perennial allergic rhinitis     1. Continue to Treat reflux:   A.  Minimize caffeine, chocolate, alcohol  B.  Ranitidine  300mg  in PM  C.  Dexilant 60 mg in AM  2.  Continue to treat inflammation:   A. Dymista - one spray each nostril 2 times per day  3.  If needed:   A.  OTC antihistamine  B.  Nasal saline  C. Ryvent - one tablet two times per day  4.  Evaluation with ENT for LPR  5.  Return to clinic 6 months or earlier if problem  Daniel Cherry needs evaluation of his throat with ENT.  We have made this recommendation last year but he never did follow through with this issue.  For now he will remain on therapy directed against reflux and inflammation of his airway as noted above and defer any further evaluation and treatment until we can get evaluation with ENT.  [name], MD Allergy / Immunology [place]

## 2018-11-05 NOTE — Patient Instructions (Signed)
  1. Continue to Treat reflux:   A.  Minimize caffeine, chocolate, alcohol  B.  Ranitidine 300mg  in PM  C.  Dexilant 60 mg in AM  2.  Continue to treat inflammation:   A. Dymista - one spray each nostril 2 times per day  3.  If needed:   A.  OTC antihistamine  B.  Nasal saline  C. Ryvent - one tablet two times per day  4.  Evaluation with ENT for LPR  5.  Return to clinic 6 months or earlier if problem

## 2018-11-06 ENCOUNTER — Encounter: Payer: Self-pay | Admitting: Allergy and Immunology

## 2018-11-06 ENCOUNTER — Telehealth: Payer: Self-pay | Admitting: *Deleted

## 2018-11-06 NOTE — Telephone Encounter (Signed)
Please refer to [facility] 

## 2018-11-06 NOTE — Telephone Encounter (Signed)
Please refer patient for Evaluation with ENT for LPR per Dr Neldon Mc

## 2018-11-08 NOTE — Telephone Encounter (Signed)
Referral has been faxed to their office.  

## 2018-11-11 NOTE — Telephone Encounter (Signed)
Noted  

## 2018-11-25 ENCOUNTER — Telehealth: Payer: Self-pay

## 2018-11-25 MED ORDER — DEXLANSOPRAZOLE 60 MG PO CPDR: 60.0000 mg | DELAYED_RELEASE_CAPSULE | Freq: Every day | ORAL | 5 refills | Status: DC

## 2018-11-25 NOTE — Telephone Encounter (Signed)
Received fax for refill on Dexilant 60mg . Patient was seen on 11/05/2018. Refill has been sent in.

## 2018-12-19 ENCOUNTER — Other Ambulatory Visit: Payer: Self-pay

## 2018-12-19 MED ORDER — CARBINOXAMINE MALEATE 6 MG PO TABS: 1.0000 | ORAL_TABLET | Freq: Two times a day (BID) | ORAL | 5 refills | Status: DC | PRN

## 2018-12-19 NOTE — Progress Notes (Signed)
Pt called stating that [facility] informed him that they are out of stock with Ryvent and don't know when they will be getting any. Advised pt that I would switch the rx to [facility] and place samples up front for pick up.

## 2018-12-20 ENCOUNTER — Telehealth: Payer: Self-pay

## 2018-12-20 MED ORDER — RANITIDINE HCL 300 MG PO TABS: 300.0000 mg | ORAL_TABLET | Freq: Every day | ORAL | 5 refills | Status: DC

## 2018-12-20 NOTE — Telephone Encounter (Signed)
Request from pharmacy to refill Zantac approved.

## 2019-01-01 ENCOUNTER — Encounter: Payer: Self-pay | Admitting: Family Medicine

## 2019-01-01 ENCOUNTER — Ambulatory Visit (INDEPENDENT_AMBULATORY_CARE_PROVIDER_SITE_OTHER): Payer: PRIVATE HEALTH INSURANCE | Admitting: Family Medicine

## 2019-01-01 VITALS — BP 112/72 | HR 81 | Temp 98.2°F | Wt 171.0 lb

## 2019-01-01 DIAGNOSIS — J0100 Acute maxillary sinusitis, unspecified: Secondary | ICD-10-CM | POA: Diagnosis not present

## 2019-01-01 MED ORDER — AMOXICILLIN 875 MG PO TABS: 875.0000 mg | ORAL_TABLET | Freq: Two times a day (BID) | ORAL | 0 refills | Status: DC

## 2019-01-01 NOTE — Progress Notes (Signed)
   Subjective:    Patient ID: Daniel Cherry, male    DOB: 05/20/1966, 53 y.o.   MRN: [id number]  HPI He complains of a 2-1/2-week history of chest congestion, myalgias, fatigue but no fever, sore throat, earache.  The nasal congestion and purulent postnasal drainage has gotten worse in the last several days as well as headache.   Review of Systems     Objective:   Physical Exam Alert and in no distress.  Nasal mucosa is normal.  Tender over frontal sinuses.  Tympanic membranes and canals are normal. Pharyngeal area is normal. Neck is supple without adenopathy or thyromegaly. Cardiac exam shows a regular sinus rhythm without murmurs or gallops. Lungs are clear to auscultation.        Assessment & Plan:  Acute non-recurrent maxillary sinusitis - Plan: amoxicillin (AMOXIL) 875 MG tablet He is to call if not entirely better when he finishes the antibiotic.

## 2019-01-01 NOTE — Patient Instructions (Signed)
Take all the antibiotic and if not totally back to normal when you finish call me °

## 2019-01-10 ENCOUNTER — Other Ambulatory Visit: Payer: Self-pay

## 2019-01-10 MED ORDER — DYMISTA 137-50 MCG/ACT NA SUSP: 1.0000 | Freq: Every day | NASAL | 5 refills | Status: DC

## 2019-01-14 ENCOUNTER — Other Ambulatory Visit: Payer: Self-pay

## 2019-01-14 MED ORDER — DEXLANSOPRAZOLE 60 MG PO CPDR: 60.0000 mg | DELAYED_RELEASE_CAPSULE | Freq: Every day | ORAL | 5 refills | Status: DC

## 2019-02-10 ENCOUNTER — Other Ambulatory Visit: Payer: Self-pay | Admitting: Family Medicine

## 2019-02-10 DIAGNOSIS — N400 Enlarged prostate without lower urinary tract symptoms: Secondary | ICD-10-CM

## 2019-02-11 ENCOUNTER — Telehealth: Payer: Self-pay

## 2019-02-11 MED ORDER — FAMOTIDINE 40 MG PO TABS: 40.0000 mg | ORAL_TABLET | Freq: Every day | ORAL | 5 refills | Status: DC

## 2019-02-11 NOTE — Addendum Note (Signed)
Addended by: Valere Dross on: 02/11/2019 09:03 AM   Modules accepted: Orders

## 2019-02-11 NOTE — Telephone Encounter (Signed)
Famotidine 40mg  tab 1 time per day.

## 2019-02-11 NOTE — Telephone Encounter (Signed)
Pharmacy requesting new rx for Ranitidine 300mg . Is Famotidine 20mg  BID appropriate?

## 2019-02-11 NOTE — Telephone Encounter (Signed)
Famotidine 40 mg sent to pharmacy.

## 2019-03-26 NOTE — Telephone Encounter (Signed)
Patient refused appt with [facility]>

## 2019-04-30 ENCOUNTER — Ambulatory Visit (INDEPENDENT_AMBULATORY_CARE_PROVIDER_SITE_OTHER): Payer: PRIVATE HEALTH INSURANCE | Admitting: Family Medicine

## 2019-04-30 ENCOUNTER — Other Ambulatory Visit: Payer: Self-pay

## 2019-04-30 ENCOUNTER — Encounter: Payer: Self-pay | Admitting: Family Medicine

## 2019-04-30 VITALS — BP 110/70 | HR 72 | Temp 97.7°F | Wt 173.0 lb

## 2019-04-30 DIAGNOSIS — W57XXXA Bitten or stung by nonvenomous insect and other nonvenomous arthropods, initial encounter: Secondary | ICD-10-CM | POA: Diagnosis not present

## 2019-04-30 DIAGNOSIS — R21 Rash and other nonspecific skin eruption: Secondary | ICD-10-CM

## 2019-04-30 DIAGNOSIS — S30860A Insect bite (nonvenomous) of lower back and pelvis, initial encounter: Secondary | ICD-10-CM

## 2019-04-30 MED ORDER — MUPIROCIN 2 % EX OINT: 1.0000 "application " | TOPICAL_OINTMENT | Freq: Two times a day (BID) | CUTANEOUS | 0 refills | Status: DC

## 2019-04-30 NOTE — Patient Instructions (Addendum)
Use the antibiotic ointment for the next week and if you notice the area worsening, let us know.   If you want to try over the counter Compound W on your leg this would be fine. You may also return to see Dr. Redmond School at your convenience.    Tick Bite Information, Adult  Ticks are insects that draw blood for food. Most ticks live in shrubs and grassy areas. They climb onto people and animals that brush against the leaves and grasses that they rest on. Then they bite, attaching themselves to the skin. Most ticks are harmless, but some ticks carry germs that can spread to a person through a bite and cause a disease. To reduce your risk of getting a disease from a tick bite, it is important to take steps to prevent tick bites. It is also important to check for ticks after being outdoors. If you find that a tick has attached to you, watch for symptoms of disease. How can I prevent tick bites? Take these steps to help prevent tick bites when you are outdoors in an area where ticks are found:  Use insect repellent that has DEET (20% or higher), picaridin, or IR3535 in it. Use it on: ? Skin that is showing. ? The top of your boots. ? Your pant legs. ? Your sleeve cuffs.  For repellent products that contain permethrin, follow product instructions. Use these products on: ? Clothing. ? Gear. ? Boots. ? Tents.  Wear protective clothing. Long sleeves and long pants offer the best protection from ticks.  Wear light-colored clothing so you can see ticks more easily.  Tuck your pant legs into your socks.  If you go walking on a trail, stay in the middle of the trail so your skin, hair, and clothing do not touch the bushes.  Avoid walking through areas with long grass.  Check for ticks on your clothing, hair, and skin often while you are outside, and check again before you go inside. Make sure to check the places that ticks attach themselves most often. These places include the scalp, neck, armpits,  waist, groin, and joint areas. Ticks that carry a disease called Lyme disease have to be attached to the skin for 24-48 hours. Checking for ticks every day will lessen your risk of this and other diseases.  When you come indoors, wash your clothes and take a shower or a bath right away. Dry your clothes in a dryer on high heat for at least 60 minutes. This will kill any ticks in your clothes. What is the proper way to remove a tick? If you find a tick on your body, remove it as soon as possible. Removing a tick sooner rather than later can prevent germs from passing from the tick to your body. To remove a tick that is crawling on your skin but has not bitten:  Go outdoors and brush the tick off.  Remove the tick with tape or a lint roller. To remove a tick that is attached to your skin:  Wash your hands.  If you have latex gloves, put them on.  Use tweezers, curved forceps, or a tick-removal tool to gently grasp the tick as close to your skin and the tick's head as possible.  Gently pull with steady, upward pressure until the tick lets go. When removing the tick: ? Take care to keep the tick's head attached to its body. ? Do not twist or jerk the tick. This can make the tick's head  or mouth break off. ? Do not squeeze or crush the tick's body. This could force disease-carrying fluids from the tick into your body. Do not try to remove a tick with heat, alcohol, petroleum jelly, or fingernail polish. Using these methods can cause the tick to salivate and regurgitate into your bloodstream, increasing your risk of getting a disease. What should I do after removing a tick?  Clean the bite area with soap and water, rubbing alcohol, or an iodine scrub.  If an antiseptic cream or ointment is available, apply a small amount to the bite site.  Wash and disinfect any instruments that you used to remove the tick. How should I dispose of a tick? To dispose of a live tick, use one of these methods:   Place it in rubbing alcohol.  Place it in a sealed bag or container.  Wrap it tightly in tape.  Flush it down the toilet. Contact a health care provider if:  You have symptoms of a disease after a tick bite. Symptoms of a tick-borne disease can occur from moments after the tick bites to up to 30 days after a tick is removed. Symptoms include: ? Muscle, joint, or bone pain. ? Difficulty walking or moving your legs. ? Numbness in the legs. ? Paralysis. ? Red rash around the tick bite area that is shaped like a target or a "bull's-eye." ? Redness and swelling in the area of the tick bite. ? Fever. ? Repeated vomiting. ? Diarrhea. ? Weight loss. ? Tender, swollen lymph glands. ? Shortness of breath. ? Cough. ? Pain in the abdomen. ? Headache. ? Abnormal tiredness. ? A change in your level of consciousness. ? Confusion. Get help right away if:  You are not able to remove a tick.  A part of a tick breaks off and gets stuck in your skin.  Your symptoms get worse. Summary  Ticks may carry germs that can spread to a person through a bite and cause disease.  Wear protective clothing and use insect repellent to prevent tick bites. Follow product instructions.  If you find a tick on your body, remove it as soon as possible. If the tick is attached, do not try to remove with heat, alcohol, petroleum jelly, or fingernail polish.  Remove the attached tick using tweezers, curved forceps, or a tick-removal tool. Gently pull with steady, upward pressure until the tick lets go. Do not twist or jerk the tick. Do not squeeze or crush the tick's body.  If you have symptoms after being bitten by a tick, contact a health care provider. This information is not intended to replace advice given to you by your health care provider. Make sure you discuss any questions you have with your health care provider. Document Released: 10/06/2000 Document Revised: 09/21/2017 Document Reviewed: 07/21/2016  Elsevier Patient Education  2020 [employer].

## 2019-04-30 NOTE — Progress Notes (Signed)
   Subjective:    Patient ID: Daniel Cherry, male    DOB: June 02, 1966, 53 y.o.   MRN: [id number]  HPI Chief Complaint  Patient presents with  . tick bite    tick bite on mid back, red, hard knot. itching, had tick bite for the last 3 weeks and also has a wart on right leg   This is my first encounter with this patient. He usually sees Dr. Redmond School.  Here with complaints of complaints of a tick bite 2-3 weeks ago.  States the tick was not small. It was not engorged. States initially his partner removed the tick but left the head in and then the patients sister later removed the head. States the area is not as red or swollen today. It is pruritic.   Denies fever, chills, night sweats, body aches, dizziness, headache, chest pain, palpitations, shortness of breath, abdominal pain, N/V/D. No arthralgias or rash.   States he has a wart on his right lower leg that he would like to have checked. It has been there for months. Wears pants mostly so not that much of an issue.   Reviewed allergies, medications, past medical, surgical, family, and social history.   Review of Systems Pertinent positives and negatives in the history of present illness.     Objective:   Physical Exam Constitutional:      General: He is not in acute distress.    Appearance: Normal appearance. He is not ill-appearing.  Pulmonary:     Effort: Pulmonary effort is normal.  Skin:    General: Skin is warm and dry.     Comments: Left upper back with a firm, raised mildly erythematous area and a black pinpoint center. No surrounding erythema and no sign of exudate or infection.   Right lower anterior leg with a 0.5 cm white, scaly lesion consistent with a wart.   Neurological:     General: No focal deficit present.     Mental Status: He is alert.    BP 110/70   Pulse 72   Temp 97.7 F (36.5 C) (Oral)   Wt 173 lb (78.5 kg)   BMI 26.30 kg/m       Assessment & Plan:  Tick bite of back, initial encounter - Plan:  mupirocin ointment (BACTROBAN) 2 %  Skin eruption   Discussed that the area does not appear to be infected but he may try using the Bactroban for the next week and warm compresses as well. Reassured him that the tick not being engorged means no blood meal and no treatment currently needed. This was also 2-3 weeks ago when the tick was removed. Encouraged him to follow up if the area is worsening.  He will return to see Dr. Redmond School if he decides he would like to have the wart removed. May try OTC Compound W for now.

## 2019-05-06 ENCOUNTER — Encounter: Payer: Self-pay | Admitting: Allergy and Immunology

## 2019-05-06 ENCOUNTER — Other Ambulatory Visit: Payer: Self-pay

## 2019-05-06 ENCOUNTER — Ambulatory Visit (INDEPENDENT_AMBULATORY_CARE_PROVIDER_SITE_OTHER): Payer: PRIVATE HEALTH INSURANCE | Admitting: Allergy and Immunology

## 2019-05-06 VITALS — BP 110/80 | HR 75 | Temp 98.2°F | Resp 16 | Ht 68.0 in

## 2019-05-06 DIAGNOSIS — J3089 Other allergic rhinitis: Secondary | ICD-10-CM | POA: Diagnosis not present

## 2019-05-06 DIAGNOSIS — K219 Gastro-esophageal reflux disease without esophagitis: Secondary | ICD-10-CM

## 2019-05-06 MED ORDER — CARBINOXAMINE MALEATE 6 MG PO TABS: 1.0000 | ORAL_TABLET | Freq: Two times a day (BID) | ORAL | 5 refills | Status: DC | PRN

## 2019-05-06 NOTE — Patient Instructions (Addendum)
  1. Continue to Treat reflux:   A.  Minimize caffeine, chocolate, alcohol  B.  Famotidine 40 mg in PM; can attempt to discontinue  C.  Dexilant 60 mg in AM  2.  If needed:   A.  OTC antihistamine  B.  Nasal saline  C.  Ryvent 6 mg - one tablet two times per day  3. Return to clinic 12 months or earlier if problem  4.  Obtain fall flu vaccine (and COVID vaccine)

## 2019-05-06 NOTE — Progress Notes (Signed)
- [facility] [place]   Follow-up Note   Referring Provider: Denita Lung, MD Primary Provider: Denita Lung, MD Date of Office Visit: 05/06/2019  Subjective:   Daniel Cherry (DOB: 1966-06-10) is a 53 y.o. male who returns to the [facility] on 05/06/2019 in re-evaluation of the following:  HPI: Frutoso returns to this clinic in evaluation of allergic rhinoconjunctivitis and LPR.  I have not seen him in this clinic since 05 November 2018.  During his last visit we recommended that he have evaluation of his throat with ENT.  He did not have that evaluation performed partially because of the coronavirus pandemic and partially based on the fact that his throat issue has basically resolved.  He has a history of LPR and has been treating it with a combination of an H2 receptor blocker and a proton pump inhibitor. What has really resulted in very good control of this issue was his almost complete elimination of caffeine consumption.  He has had very little issues with his nose.  He no longer requires a nasal steroid.  He does use RyVent on a consistent basis which definitely helps his airway.  There has been some difficulty in acquiring RyVent on occasion and he has had to use several different pharmacies to obtain this medication.    Allergies as of 05/06/2019   No Known Allergies     Medication List    Carbinoxamine Maleate 6 MG Tabs Commonly known as: RyVent Take 1 tablet by mouth 2 (two) times daily as needed. Pt contact number [phone]   dexlansoprazole 60 MG capsule Commonly known as: DEXILANT Take 1 capsule (60 mg total) by mouth daily.   famotidine 40 MG tablet Commonly known as: PEPCID Take 1 tablet (40 mg total) by mouth daily.   finasteride 5 MG tablet Commonly known as: PROSCAR TAKE 1 TABLET BY MOUTH EVERY DAY   mirtazapine 30 MG tablet Commonly known as: REMERON Take 60 mg by mouth at bedtime.   mupirocin  ointment 2 % Commonly known as: Bactroban Place 1 application into the nose 2 (two) times daily.   traZODone 50 MG tablet Commonly known as: DESYREL TAKE 1/2 - 1 TAB ABOUT 1 HOUR BEFORE BED. MAY INCREASE IF NECESSARY IN STEPS OF 25MG  UP TO 2 TABS   Trintellix 20 MG Tabs tablet Generic drug: vortioxetine HBr TAKE 1 TABLET BY MOUTH EVERY DAY       Past Medical History:  Diagnosis Date  . Anxiety   . Depression     Past Surgical History:  Procedure Laterality Date  . DERMABRASION  1986    Review of systems negative except as noted in HPI / PMHx or noted below:  Review of Systems  Constitutional: Negative.   HENT: Negative.   Eyes: Negative.   Respiratory: Negative.   Cardiovascular: Negative.   Gastrointestinal: Negative.   Genitourinary: Negative.   Musculoskeletal: Negative.   Skin: Negative.   Neurological: Negative.   Endo/Heme/Allergies: Negative.   Psychiatric/Behavioral: Negative.      Objective:   Vitals:   05/06/19 1639  BP: 110/80  Pulse: 75  Resp: 16  Temp: 98.2 F (36.8 C)  SpO2: 98%   Height: 5\' 8"  (172.7 cm)      Physical Exam Constitutional:      Appearance: He is not diaphoretic.  HENT:     Head: Normocephalic.     Right Ear: Tympanic membrane, ear canal and external  ear normal.     Left Ear: Tympanic membrane, ear canal and external ear normal.     Nose: Nose normal. No mucosal edema or rhinorrhea.     Mouth/Throat:     Pharynx: Uvula midline. No oropharyngeal exudate.  Eyes:     Conjunctiva/sclera: Conjunctivae normal.  Neck:     Thyroid: No thyromegaly.     Trachea: Trachea normal. No tracheal tenderness or tracheal deviation.  Cardiovascular:     Rate and Rhythm: Normal rate and regular rhythm.     Heart sounds: Normal heart sounds, S1 normal and S2 normal. No murmur.  Pulmonary:     Effort: No respiratory distress.     Breath sounds: Normal breath sounds. No stridor. No wheezing or rales.  Lymphadenopathy:     Head:      Right side of head: No tonsillar adenopathy.     Left side of head: No tonsillar adenopathy.     Cervical: No cervical adenopathy.  Skin:    Findings: No erythema or rash.     Nails: There is no clubbing.   Neurological:     Mental Status: He is alert.     Diagnostics: none  Assessment and Plan:   1. Perennial allergic rhinitis   2. LPRD (laryngopharyngeal reflux disease)     1. Continue to Treat reflux:   A.  Minimize caffeine, chocolate, alcohol  B.  Famotidine 40 mg in PM; can attempt to discontinue  C.  Dexilant 60 mg in AM  2.  If needed:   A.  OTC antihistamine  B.  Nasal saline  C.  Ryvent 6 mg - one tablet two times per day  3. Return to clinic 12 months or earlier if problem  4.  Obtain fall flu vaccine (and COVID vaccine)  Dain is really doing quite well on his current plan of therapy directed against reflux induced respiratory disease and inflammation of his airway.  Now that he has modified his caffeine consumption significantly there may be an opportunity for him to consolidate his medical treatment and he can attempt to discontinue his famotidine at this point.  He will remain on this therapy noted above and I will see him back in this clinic in 1 year or earlier if there is a problem.  [name], MD Allergy / Immunology [place]

## 2019-05-07 ENCOUNTER — Encounter: Payer: Self-pay | Admitting: Allergy and Immunology

## 2019-05-08 ENCOUNTER — Ambulatory Visit (INDEPENDENT_AMBULATORY_CARE_PROVIDER_SITE_OTHER): Payer: PRIVATE HEALTH INSURANCE | Admitting: Family Medicine

## 2019-05-08 ENCOUNTER — Encounter: Payer: Self-pay | Admitting: Family Medicine

## 2019-05-08 ENCOUNTER — Other Ambulatory Visit: Payer: Self-pay

## 2019-05-08 VITALS — BP 110/74 | HR 78 | Temp 98.1°F | Wt 171.2 lb

## 2019-05-08 DIAGNOSIS — B079 Viral wart, unspecified: Secondary | ICD-10-CM

## 2019-05-08 NOTE — Progress Notes (Signed)
   Subjective:    Patient ID: Daniel Cherry, male    DOB: [birth year], 53 y.o.   MRN: [id number]  HPI He is here for removal of a wart present on the right lateral calf.   Review of Systems     Objective:   Physical Exam A 1 cm raised whitish lesion is noted on the right calf.       Assessment & Plan:   Encounter Diagnosis  Name Primary?  . Viral warts, unspecified type Yes  The lesion was injected with Xylocaine and epinephrine.  It was hyfrecated without difficulty.

## 2019-05-20 ENCOUNTER — Ambulatory Visit: Payer: PRIVATE HEALTH INSURANCE | Admitting: Family Medicine

## 2019-07-04 ENCOUNTER — Other Ambulatory Visit: Payer: Self-pay | Admitting: Allergy and Immunology

## 2019-08-07 ENCOUNTER — Other Ambulatory Visit: Payer: Self-pay | Admitting: Allergy and Immunology

## 2019-10-13 ENCOUNTER — Other Ambulatory Visit: Payer: Self-pay

## 2019-10-13 MED ORDER — CARBINOXAMINE MALEATE 6 MG PO TABS: 1.0000 | ORAL_TABLET | Freq: Two times a day (BID) | ORAL | 2 refills | Status: DC | PRN

## 2019-10-28 ENCOUNTER — Other Ambulatory Visit: Payer: Self-pay | Admitting: Allergy and Immunology

## 2020-01-19 ENCOUNTER — Other Ambulatory Visit: Payer: Self-pay

## 2020-01-19 ENCOUNTER — Encounter: Payer: Self-pay | Admitting: Family Medicine

## 2020-01-19 ENCOUNTER — Ambulatory Visit (INDEPENDENT_AMBULATORY_CARE_PROVIDER_SITE_OTHER): Payer: PRIVATE HEALTH INSURANCE | Admitting: Family Medicine

## 2020-01-19 VITALS — BP 116/80 | HR 72 | Temp 96.8°F | Ht 68.0 in | Wt 178.0 lb

## 2020-01-19 DIAGNOSIS — R5383 Other fatigue: Secondary | ICD-10-CM

## 2020-01-19 DIAGNOSIS — R0600 Dyspnea, unspecified: Secondary | ICD-10-CM | POA: Diagnosis not present

## 2020-01-19 DIAGNOSIS — E782 Mixed hyperlipidemia: Secondary | ICD-10-CM | POA: Diagnosis not present

## 2020-01-19 DIAGNOSIS — Z8249 Family history of ischemic heart disease and other diseases of the circulatory system: Secondary | ICD-10-CM | POA: Diagnosis not present

## 2020-01-19 DIAGNOSIS — R0609 Other forms of dyspnea: Secondary | ICD-10-CM

## 2020-01-19 NOTE — Patient Instructions (Signed)
Stay well hydrated. Avoid excess caffeine and alcohol. Listen to your body when it comes to exercise.  Return for fasting labs.  Based on your history, I suspect possibly PVC or PAC's. Your heart sounded regular during exam, with no skipped or extra beats. Let us know if things persist or worsen.    Palpitations Palpitations are feelings that your heartbeat is irregular or is faster than normal. It may feel like your heart is fluttering or skipping a beat. Palpitations are usually not a serious problem. They may be caused by many things, including smoking, caffeine, alcohol, stress, and certain medicines or drugs. Most causes of palpitations are not serious. However, some palpitations can be a sign of a serious problem. You may need further tests to rule out serious medical problems. Follow these instructions at home:     Pay attention to any changes in your condition. Take these actions to help manage your symptoms: Eating and drinking  Avoid foods and drinks that may cause palpitations. These may include: ? Caffeinated coffee, tea, soft drinks, diet pills, and energy drinks. ? Chocolate. ? Alcohol. Lifestyle  Take steps to reduce your stress and anxiety. Things that can help you relax include: ? Yoga. ? Mind-body activities, such as deep breathing, meditation, or using words and images to create positive thoughts (guided imagery). ? Physical activity, such as swimming, jogging, or walking. Tell your health care provider if your palpitations increase with activity. If you have chest pain or shortness of breath with activity, do not continue the activity until you are seen by your health care provider. ? Biofeedback. This is a method that helps you learn to use your mind to control things in your body, such as your heartbeat.  Do not use drugs, including cocaine or ecstasy. Do not use marijuana.  Get plenty of rest and sleep. Keep a regular bed time. General instructions  Take  over-the-counter and prescription medicines only as told by your health care provider.  Do not use any products that contain nicotine or tobacco, such as cigarettes and e-cigarettes. If you need help quitting, ask your health care provider.  Keep all follow-up visits as told by your health care provider. This is important. These may include visits for further testing if palpitations do not go away or get worse. Contact a health care provider if you:  Continue to have a fast or irregular heartbeat after 24 hours.  Notice that your palpitations occur more often. Get help right away if you:  Have chest pain or shortness of breath.  Have a severe headache.  Feel dizzy or you faint. Summary  Palpitations are feelings that your heartbeat is irregular or is faster than normal. It may feel like your heart is fluttering or skipping a beat.  Palpitations may be caused by many things, including smoking, caffeine, alcohol, stress, certain medicines, and drugs.  Although most causes of palpitations are not serious, some causes can be a sign of a serious medical problem.  Get help right away if you faint or have chest pain, shortness of breath, a severe headache, or dizziness. This information is not intended to replace advice given to you by your health care provider. Make sure you discuss any questions you have with your health care provider. Document Revised: 11/21/2017 Document Reviewed: 11/21/2017 Elsevier Patient Education  2020 [place].   Premature Ventricular Contraction  A premature ventricular contraction (PVC) is a common kind of irregular heartbeat (arrhythmia). These contractions are extra heartbeats that start  in the ventricles of the heart and occur too early in the normal sequence. During the PVC, the heart's normal electrical pathway is not used, so the beat is shorter and less effective. In most cases, these contractions come and go and do not require treatment. What are  the causes? Common causes of the condition include:  Smoking.  Drinking alcohol.  Certain medicines.  Some illegal drugs.  Stress.  Caffeine. Certain medical conditions can also cause PVCs:  Heart failure.  Heart attack, or coronary artery disease.  Heart valve problems.  Changes in minerals in the blood (electrolytes).  Low blood oxygen levels or high carbon dioxide levels. In many cases, the cause of this condition is not known. What are the signs or symptoms? The main symptom of this condition is fast or skipped heartbeats (palpitations). Other symptoms include:  Chest pain.  Shortness of breath.  Feeling tired.  Dizziness.  Difficulty exercising. In some cases, there are no symptoms. How is this diagnosed? This condition may be diagnosed based on:  Your medical history.  A physical exam. During the exam, the health care provider will check for irregular heartbeats.  Tests, such as: ? An ECG (electrocardiogram) to monitor the electrical activity of your heart. ? An ambulatory cardiac monitor. This device records your heartbeats for 24 hours or more. ? Stress tests to see how exercise affects your heart rhythm and blood supply. ? An echocardiogram. This test uses sound waves (ultrasound) to produce an image of your heart. ? An electrophysiology study (EPS). This test checks for electrical problems in your heart. How is this treated? Treatment for this condition depends on any underlying conditions, the type of PVCs that you are having, and how much the symptoms are interfering with your daily life. Possible treatments include:  Avoiding things that cause premature contractions (triggers). These include caffeine and alcohol.  Taking medicines if symptoms are severe or if the extra heartbeats are frequent.  Getting treatment for underlying conditions that cause PVCs.  Having an implantable cardioverter defibrillator (ICD), if you are at risk for a serious  arrhythmia. The ICD is a small device that is inserted into your chest to monitor your heartbeat. When it senses an irregular heartbeat, it sends a shock to bring the heartbeat back to normal.  Having a procedure to destroy the portion of the heart tissue that sends out abnormal signals (catheter ablation). In some cases, no treatment is required. Follow these instructions at home: Lifestyle  Do not use any products that contain nicotine or tobacco, such as cigarettes, e-cigarettes, and chewing tobacco. If you need help quitting, ask your health care provider.  Do not use illegal drugs.  Exercise regularly. Ask your health care provider what type of exercise is safe for you.  Try to get at least 7-9 hours of sleep each night, or as much as recommended by your health care provider.  Find healthy ways to manage stress. Avoid stressful situations when possible. Alcohol use  Do not drink alcohol if: ? Your health care provider tells you not to drink. ? You are pregnant, may be pregnant, or are planning to become pregnant. ? Alcohol triggers your episodes.  If you drink alcohol: ? Limit how much you use to:  0-1 drink a day for women.  0-2 drinks a day for men.  Be aware of how much alcohol is in your drink. In the [place] one drink equals one 12 oz bottle of beer (355 mL), one 5 oz glass  of wine (148 mL), or one 1 oz glass of hard liquor (44 mL). General instructions  Take over-the-counter and prescription medicines only as told by your health care provider.  If caffeine triggers episodes of PVC, do not eat, drink, or use anything with caffeine in it.  Keep all follow-up visits as told by your health care provider. This is important. Contact a health care provider if you:  Feel palpitations. Get help right away if you:  Have chest pain.  Have shortness of breath.  Have sweating for no reason.  Have nausea and vomiting.  Become light-headed or you faint. Summary  A  premature ventricular contraction (PVC) is a common kind of irregular heartbeat (arrhythmia).  In most cases, these contractions come and go and do not require treatment.  You may need to wear an ambulatory cardiac monitor. This records your heartbeats for 24 hours or more.  Treatment depends on any underlying conditions, the type of PVCs that you are having, and how much the symptoms are interfering with your daily life. This information is not intended to replace advice given to you by your health care provider. Make sure you discuss any questions you have with your health care provider. Document Revised: 07/04/2018 Document Reviewed: 07/04/2018 Elsevier Patient Education  2020 [employer].

## 2020-01-19 NOTE — Progress Notes (Signed)
Chief Complaint  Patient presents with  . Fatigue    started a couple of months ago and has worsened. Has noticed his heart flutter a few time over the last few months. Friday noght was the last time and he felt heartbeat in his ears as well. Has been experiencing SOB with exertion. Has some right arm 3-4 months ag0 (but did some heavy weight lifting), also had some lightheadedness and indigestion but he dismissed it at the time.    Patient reports that he is an active male, exercising 3/week. Complaining of increased fatigue over the last 2-3 months, worsening. Just wants to lay on the couch.  Denies depression.  3-4 times in the last month--felt like a twitch in the left chest.  He was laying on left side one evening, when he felt his pulse in his ear, and for 2 seconds it felt "weird"--fast, weird, not like a heartbeat. Felt like the twitch that he was experiencing in the chest, and realized that it was his heart.  Lasted for a few seconds.  No associated dizziness, chest pain, SOB. Notices when laying down in bed at night, not during the day or with exertion.  Not exercising as much due to fatigue.  Gardening work over the weekend, and he was very tired and somewhat winded. Last regular exercise was over 2 weeks ago, nothing for 2 weeks (busy, and tired). Felt okay at the gym when he had been going--energizes him.  No change to psych meds in over a year, denies SE from meds.  No changes to hair/skin/bowels. 5# weight gain in the last month.  PMH reviewed-- Last lipids were elevated (nonfasting per pt)--Dr. Redmond School recommended statin but pt reports he never took it, wanted to try and do it on his own.  Hasn't had lipids checked since then. Admits diet hasn't been good lately.  Lab Results  Component Value Date   CHOL 212 (H) 06/05/2018   HDL 36 (L) 06/05/2018   LDLCALC 136 (H) 06/05/2018   TRIG 201 (H) 06/05/2018   CHOLHDL 5.9 (H) 06/05/2018   PMH also notable for Anxiety,  insomnia, GERD  FH: 1 bro with MI early 39's 1 bro with CHF Sis with multiple MI (smoker)--in her late 59's for her first  Outpatient Encounter Medications as of 01/19/2020  Medication Sig  . DEXILANT 60 MG capsule TAKE 1 CAPSULE BY MOUTH EVERY DAY  . famotidine (PEPCID) 40 MG tablet TAKE 1 TABLET BY MOUTH EVERY DAY  . finasteride (PROSCAR) 5 MG tablet TAKE 1 TABLET BY MOUTH EVERY DAY  . mirtazapine (REMERON) 30 MG tablet Take 60 mg by mouth at bedtime.  . traZODone (DESYREL) 50 MG tablet TAKE 1/2 - 1 TAB ABOUT 1 HOUR BEFORE BED. MAY INCREASE IF NECESSARY IN STEPS OF 25MG UP TO 2 TABS  . vortioxetine HBr (TRINTELLIX) 20 MG TABS TAKE 1 TABLET BY MOUTH EVERY DAY  . [DISCONTINUED] Carbinoxamine Maleate (RYVENT) 6 MG TABS Take 1 tablet by mouth 2 (two) times daily as needed. Pt contact number [phone]  . [DISCONTINUED] mupirocin ointment (BACTROBAN) 2 % Place 1 application into the nose 2 (two) times daily. (Patient not taking: Reported on 05/08/2019)   Facility-Administered Encounter Medications as of 01/19/2020  Medication  . 0.9 %  sodium chloride infusion   No Known Allergies  ROS: no fever, chills, URI symptoms,  No headaches, dizziness.  No chest pain.  +fatigue, possible palpitations, some weight gain per HPI. Denies depression, anxiety and insomnia controlled with  meds. GERD controlled with meds.   PHYSICAL EXAM:   BP 116/80   Pulse 72 Comment: irregular  Temp (!) 96.8 F (36 C) (Other (Comment))   Ht '5\' 8"'  (1.727 m)   Wt 178 lb (80.7 kg)   BMI 27.06 kg/m   Wt Readings from Last 3 Encounters:  01/19/20 178 lb (80.7 kg)  05/08/19 171 lb 3.2 oz (77.7 kg)  04/30/19 173 lb (78.5 kg)   Pleasant, well-appearing male in no distress HEENT: conjunctiva and sclera are clear, EOMI, wearing mask Neck: No lymphadenopathy, thyromegaly or carotid bruit Heart: regular rate and rhythm, no ectopy or murmur Lungs: clear bilaterally Back: no spinal or CVA tenderness Abdomen:  soft, nontender, no epigastric tenderness or mass Chest wall: nontender Extremities: no edema Psych: normal mood, affect, hygiene and grooming  EKG:  NSR. Normal, borderline LAE.  ASSESSMENT/PLAN;  Fatigue, unspecified type - Check labs.  May need to f/u with [facility] if persists/worsens - Plan: VITAMIN D 25 Hydroxy (Vit-D Deficiency, Fractures), CBC with Differential/Platelet, TSH, Comprehensive metabolic panel, EKG 10-OFHQ  Mixed hyperlipidemia - past due for recheck. Counseled re: genetic factors that he has no control over. Return to check lipids when fasting - Plan: Lipid panel  Family history of heart disease - May need to be on statin, and discussed FH, genetic factors as well as diet.  - Plan: Lipid panel, EKG 12-Lead  Dyspnea on exertion - Plan: CBC with Differential/Platelet, EKG 12-Lead  Reassured that EKG was normal. Will return tomorrow for fasting labs.  c-met cbc, TSH, D  May need additional labs (PSA, testosterone) if all normal to further evaluate. Schedule CPE with Dr. Redmond School

## 2020-01-20 ENCOUNTER — Other Ambulatory Visit: Payer: PRIVATE HEALTH INSURANCE

## 2020-01-20 DIAGNOSIS — Z8249 Family history of ischemic heart disease and other diseases of the circulatory system: Secondary | ICD-10-CM

## 2020-01-20 DIAGNOSIS — E782 Mixed hyperlipidemia: Secondary | ICD-10-CM

## 2020-01-20 DIAGNOSIS — R0609 Other forms of dyspnea: Secondary | ICD-10-CM

## 2020-01-20 DIAGNOSIS — R5383 Other fatigue: Secondary | ICD-10-CM

## 2020-01-21 LAB — CBC WITH DIFFERENTIAL/PLATELET
Basophils Absolute: 0 10*3/uL (ref 0.0–0.2)
Basos: 1 %
EOS (ABSOLUTE): 0.3 10*3/uL (ref 0.0–0.4)
Eos: 4 %
Hematocrit: 42.2 % (ref 37.5–51.0)
Hemoglobin: 14.7 g/dL (ref 13.0–17.7)
Immature Grans (Abs): 0.1 10*3/uL (ref 0.0–0.1)
Immature Granulocytes: 1 %
Lymphocytes Absolute: 1.8 10*3/uL (ref 0.7–3.1)
Lymphs: 29 %
MCH: 28.8 pg (ref 26.6–33.0)
MCHC: 34.8 g/dL (ref 31.5–35.7)
MCV: 83 fL (ref 79–97)
Monocytes Absolute: 0.5 10*3/uL (ref 0.1–0.9)
Monocytes: 8 %
Neutrophils Absolute: 3.6 10*3/uL (ref 1.4–7.0)
Neutrophils: 57 %
Platelets: 228 10*3/uL (ref 150–450)
RBC: 5.1 x10E6/uL (ref 4.14–5.80)
RDW: 12.5 % (ref 11.6–15.4)
WBC: 6.3 10*3/uL (ref 3.4–10.8)

## 2020-01-21 LAB — COMPREHENSIVE METABOLIC PANEL
ALT: 26 IU/L (ref 0–44)
AST: 24 IU/L (ref 0–40)
Albumin/Globulin Ratio: 2.1 (ref 1.2–2.2)
Albumin: 4.7 g/dL (ref 3.8–4.9)
Alkaline Phosphatase: 92 IU/L (ref 39–117)
BUN/Creatinine Ratio: 8 — ABNORMAL LOW (ref 9–20)
BUN: 9 mg/dL (ref 6–24)
Bilirubin Total: 0.4 mg/dL (ref 0.0–1.2)
CO2: 26 mmol/L (ref 20–29)
Calcium: 9.7 mg/dL (ref 8.7–10.2)
Chloride: 101 mmol/L (ref 96–106)
Creatinine, Ser: 1.11 mg/dL (ref 0.76–1.27)
GFR calc Af Amer: 87 mL/min/{1.73_m2} (ref >59)
GFR calc non Af Amer: 75 mL/min/{1.73_m2} (ref >59)
Globulin, Total: 2.2 g/dL (ref 1.5–4.5)
Glucose: 89 mg/dL (ref 65–99)
Potassium: 4 mmol/L (ref 3.5–5.2)
Sodium: 140 mmol/L (ref 134–144)
Total Protein: 6.9 g/dL (ref 6.0–8.5)

## 2020-01-21 LAB — LIPID PANEL
Chol/HDL Ratio: 6.9 ratio — ABNORMAL HIGH (ref 0.0–5.0)
Cholesterol, Total: 264 mg/dL — ABNORMAL HIGH (ref 100–199)
HDL: 38 mg/dL — ABNORMAL LOW (ref >39)
LDL Chol Calc (NIH): 155 mg/dL — ABNORMAL HIGH (ref 0–99)
Triglycerides: 375 mg/dL — ABNORMAL HIGH (ref 0–149)
VLDL Cholesterol Cal: 71 mg/dL — ABNORMAL HIGH (ref 5–40)

## 2020-01-21 LAB — TSH: TSH: 1.49 u[IU]/mL (ref 0.450–4.500)

## 2020-01-21 LAB — VITAMIN D 25 HYDROXY (VIT D DEFICIENCY, FRACTURES): Vit D, 25-Hydroxy: 37.5 ng/mL (ref 30.0–100.0)

## 2020-01-21 MED ORDER — ROSUVASTATIN CALCIUM 10 MG PO TABS: 10.0000 mg | ORAL_TABLET | Freq: Every day | ORAL | 2 refills | Status: DC

## 2020-02-12 ENCOUNTER — Telehealth: Payer: Self-pay

## 2020-02-12 NOTE — Telephone Encounter (Signed)
LVM advising pt of the need to change CPE date 03-04-20. Per Beverlee Nims Dr. Redmond School will not be in the office that day and would he be able to come in 03-03-20. [place]

## 2020-02-17 ENCOUNTER — Other Ambulatory Visit: Payer: Self-pay | Admitting: *Deleted

## 2020-02-17 MED ORDER — CARBINOXAMINE MALEATE 6 MG PO TABS: 1.0000 | ORAL_TABLET | Freq: Two times a day (BID) | ORAL | 5 refills | Status: DC

## 2020-02-23 ENCOUNTER — Other Ambulatory Visit: Payer: Self-pay | Admitting: Family Medicine

## 2020-02-23 DIAGNOSIS — N400 Enlarged prostate without lower urinary tract symptoms: Secondary | ICD-10-CM

## 2020-03-04 ENCOUNTER — Encounter: Payer: PRIVATE HEALTH INSURANCE | Admitting: Family Medicine

## 2020-05-02 ENCOUNTER — Other Ambulatory Visit: Payer: Self-pay | Admitting: Family Medicine

## 2020-05-02 ENCOUNTER — Other Ambulatory Visit: Payer: Self-pay | Admitting: Allergy and Immunology

## 2020-05-02 DIAGNOSIS — E782 Mixed hyperlipidemia: Secondary | ICD-10-CM

## 2020-05-14 ENCOUNTER — Other Ambulatory Visit: Payer: Self-pay

## 2020-05-14 ENCOUNTER — Ambulatory Visit (INDEPENDENT_AMBULATORY_CARE_PROVIDER_SITE_OTHER): Payer: PRIVATE HEALTH INSURANCE | Admitting: Family Medicine

## 2020-05-14 ENCOUNTER — Encounter: Payer: Self-pay | Admitting: Family Medicine

## 2020-05-14 VITALS — BP 116/88 | HR 84 | Temp 97.7°F | Ht 68.0 in | Wt 176.0 lb

## 2020-05-14 DIAGNOSIS — K219 Gastro-esophageal reflux disease without esophagitis: Secondary | ICD-10-CM

## 2020-05-14 DIAGNOSIS — Z0000 Encounter for general adult medical examination without abnormal findings: Secondary | ICD-10-CM | POA: Diagnosis not present

## 2020-05-14 DIAGNOSIS — J309 Allergic rhinitis, unspecified: Secondary | ICD-10-CM | POA: Diagnosis not present

## 2020-05-14 DIAGNOSIS — Z860100 Personal history of colon polyps, unspecified: Secondary | ICD-10-CM

## 2020-05-14 DIAGNOSIS — Z8249 Family history of ischemic heart disease and other diseases of the circulatory system: Secondary | ICD-10-CM | POA: Diagnosis not present

## 2020-05-14 DIAGNOSIS — E782 Mixed hyperlipidemia: Secondary | ICD-10-CM

## 2020-05-14 DIAGNOSIS — F339 Major depressive disorder, recurrent, unspecified: Secondary | ICD-10-CM | POA: Insufficient documentation

## 2020-05-14 DIAGNOSIS — N400 Enlarged prostate without lower urinary tract symptoms: Secondary | ICD-10-CM

## 2020-05-14 DIAGNOSIS — E785 Hyperlipidemia, unspecified: Secondary | ICD-10-CM | POA: Diagnosis not present

## 2020-05-14 DIAGNOSIS — Z1159 Encounter for screening for other viral diseases: Secondary | ICD-10-CM

## 2020-05-14 LAB — POCT URINALYSIS DIP (PROADVANTAGE DEVICE)
Bilirubin, UA: NEGATIVE
Blood, UA: NEGATIVE
Glucose, UA: NEGATIVE mg/dL
Ketones, POC UA: NEGATIVE mg/dL
Leukocytes, UA: NEGATIVE
Nitrite, UA: NEGATIVE
Protein Ur, POC: NEGATIVE mg/dL
Specific Gravity, Urine: 1.01
Urobilinogen, Ur: NEGATIVE
pH, UA: 6 (ref 5.0–8.0)

## 2020-05-14 MED ORDER — FAMOTIDINE 40 MG PO TABS: 40.0000 mg | ORAL_TABLET | Freq: Every day | ORAL | 3 refills | Status: DC

## 2020-05-14 MED ORDER — ROSUVASTATIN CALCIUM 10 MG PO TABS: 10.0000 mg | ORAL_TABLET | Freq: Every day | ORAL | 3 refills | Status: DC

## 2020-05-14 MED ORDER — FINASTERIDE 5 MG PO TABS: 5.0000 mg | ORAL_TABLET | Freq: Every day | ORAL | 3 refills | Status: DC

## 2020-05-14 NOTE — Addendum Note (Signed)
Addended by: Edgar Frisk on: 05/14/2020 10:34 AM   Modules accepted: Orders

## 2020-05-14 NOTE — Progress Notes (Signed)
   Subjective:    Patient ID: Daniel Cherry, male    DOB: September 12, 1966, 54 y.o.   MRN: [id number]  HPI He is here for complete examination.  He is now being followed by Dr. Pearson Grippe for his depression and seems to be doing well on the present medication regimen.  Apparently he has had 1 other episode of this in the past.  He also has a family history of heart disease and is now taking Crestor.  He is having no difficulty with that.  He has a lot of laryngeal reflux and was placed on Dexilant as well as Pepcid for control of this.  Is eventually switched to [place] only.  His allergies seem to be under good control.  He is thinking he has helped some with that.  Does have a history of colonic polyps and is scheduled for regular 5-year follow-up.  There is a history of BPH in the past but he states that urology told him it was not BPH related but more bladder function.  He did try Flomax but found it to not be very useful.  He would like to continue on finasteride mainly for hair growth.  His work and home life are going well.  Family and social history as well as health maintenance and immunizations was reviewed   Review of Systems  All other systems reviewed and are negative.      Objective:   Physical Exam Routine general medical examination at a health care facility  Family history of heart disease in male family member before age 31 - Plan: Lipid panel  Allergic rhinitis, unspecified seasonality, unspecified trigger  Hyperlipidemia LDL goal <100 - Plan: Lipid panel  Gastroesophageal reflux disease without esophagitis - Plan: famotidine (PEPCID) 40 MG tablet, DISCONTINUED: famotidine (PEPCID) 40 MG tablet  History of colonic polyps  Depression, recurrent ([place])  Encounter for hepatitis C screening test for low risk patient - Plan: Hepatitis C antibody  BPH (benign prostatic hyperplasia) - Plan: finasteride (PROSCAR) 5 MG tablet, DISCONTINUED: finasteride (PROSCAR) 5 MG  tablet  Mixed hyperlipidemia - past due for recheck. Counseled re: genetic factors that he has no control over. Return to check lipids when fasting - Plan: rosuvastatin (CRESTOR) 10 MG tablet, DISCONTINUED: rosuvastatin (CRESTOR) 10 MG tablet  Continue to treat the allergies on an as-needed basis.  He will continue on present medication regimen.  He has a follow-up concerning the colonoscopy.  He will continue to be followed by Dr. Albertine Patricia for his depression. Recommend that he switch from [place] to [place].  Described possible adverse reactions with long-term use of Dexilant.      Assessment & Plan:

## 2020-05-15 LAB — LIPID PANEL
Chol/HDL Ratio: 4.7 ratio (ref 0.0–5.0)
Cholesterol, Total: 186 mg/dL (ref 100–199)
HDL: 40 mg/dL (ref >39)
LDL Chol Calc (NIH): 91 mg/dL (ref 0–99)
Triglycerides: 335 mg/dL — ABNORMAL HIGH (ref 0–149)
VLDL Cholesterol Cal: 55 mg/dL — ABNORMAL HIGH (ref 5–40)

## 2020-05-15 LAB — HEPATITIS C ANTIBODY: Hep C Virus Ab: <0.1 s/co ratio (ref 0.0–0.9)

## 2020-05-29 ENCOUNTER — Other Ambulatory Visit: Payer: Self-pay | Admitting: Allergy and Immunology

## 2020-11-05 ENCOUNTER — Encounter: Payer: Self-pay | Admitting: Family Medicine

## 2020-11-05 ENCOUNTER — Other Ambulatory Visit: Payer: Self-pay

## 2020-11-05 ENCOUNTER — Telehealth (INDEPENDENT_AMBULATORY_CARE_PROVIDER_SITE_OTHER): Payer: PRIVATE HEALTH INSURANCE | Admitting: Family Medicine

## 2020-11-05 VITALS — Temp 97.8°F | Wt 176.0 lb

## 2020-11-05 DIAGNOSIS — J0110 Acute frontal sinusitis, unspecified: Secondary | ICD-10-CM

## 2020-11-05 MED ORDER — AMOXICILLIN 875 MG PO TABS: 875.0000 mg | ORAL_TABLET | Freq: Two times a day (BID) | ORAL | 0 refills | Status: DC

## 2020-11-05 NOTE — Progress Notes (Signed)
   Subjective:    Patient ID: Daniel Cherry, male    DOB: [birth year], 55 y.o.   MRN: [id number]  HPI I connected with  Daniel Cherry on 11/05/20 by a video enabled telemedicine application and verified that I am speaking with the correct person using two identifiers.  Caregility used.  I am at home.  He is at his house. I discussed the limitations of evaluation and management by telemedicine. The patient expressed understanding and agreed to proceed. Approximately 3 weeks ago he had cold symptoms with chest congestion and sinus congestion.  This went away however in the last several days he has noted increased sinus pressure, headache, postnasal drainage but no fever, chills, sore throat, sneezing, itchy watery eyes.   Review of Systems     Objective:   Physical Exam Alert and in no distress.  Slightly toxic appearing.       Assessment & Plan:  Acute non-recurrent frontal sinusitis - Plan: amoxicillin (AMOXIL) 875 MG tablet He is to take all the antibiotic and call me if not totally back to normal. 20 minutes spent in review of his record, history documentation and counseling.

## 2020-11-13 ENCOUNTER — Other Ambulatory Visit: Payer: Self-pay | Admitting: Allergy and Immunology

## 2020-11-23 ENCOUNTER — Encounter: Payer: Self-pay | Admitting: Allergy & Immunology

## 2020-11-23 ENCOUNTER — Other Ambulatory Visit: Payer: Self-pay

## 2020-11-23 ENCOUNTER — Ambulatory Visit (INDEPENDENT_AMBULATORY_CARE_PROVIDER_SITE_OTHER): Payer: PRIVATE HEALTH INSURANCE | Admitting: Allergy & Immunology

## 2020-11-23 VITALS — BP 102/60 | HR 83 | Temp 98.4°F | Resp 18 | Ht 68.0 in | Wt 178.4 lb

## 2020-11-23 DIAGNOSIS — J3089 Other allergic rhinitis: Secondary | ICD-10-CM | POA: Diagnosis not present

## 2020-11-23 DIAGNOSIS — K219 Gastro-esophageal reflux disease without esophagitis: Secondary | ICD-10-CM

## 2020-11-23 NOTE — Progress Notes (Unsigned)
FOLLOW UP  Date of Service/Encounter:  11/23/20   Assessment:   Perennial and seasonal allergic rhinitis (mold, trees)  LPRD (laryngopharyngeal reflux disease)  Plan/Recommendations:   1. Perennial allergic rhinitis - We will increase your carbinoxamine 8mg  twice daily to help with drying up your nose/thorat. - Add on Xhance one spray per nostril once daily (this might be less likely to cause bloody noses).  - Call us in two weeks with how you are doing. - We could do allergy shots, but these are less effective with mold.   2. Return in about 3 months (around 02/20/2021).   Subjective:   Daniel Cherry is a 55 y.o. male presenting today for follow up of  Chief Complaint  Patient presents with  . Allergic Rhinitis     Daniel Cherry has a history of the following: Patient Active Problem List   Diagnosis Date Noted  . Depression, recurrent ([place]) 05/14/2020  . Gastroesophageal reflux disease without esophagitis 05/14/2020  . Allergic rhinitis 06/05/2018  . Hyperlipidemia LDL goal <100 08/11/2014  . Hypertriglyceridemia 08/11/2014  . Family history of heart disease in male family member before age 19 03/30/2011  . BPH (benign prostatic hyperplasia) 03/30/2011    History obtained from: chart review and patient.  Daniel Cherry is a 55 y.o. male presenting for a follow up visit.  He has previously been followed by Dr. Neldon Mc.  His last visit was in July 2020.  At that time, dietary changes to minimize reflux including decreasing coffee, chocolate, and alcohol were recommended.  He was continued on famotidine and Dexilant.  He was also continued on over-the-counter antihistamine as well as nasal saline rinses and RyVent 6 mg as needed.  Per his note, it he was doing very well at that visits.  Since the last visit, he has mostly done well. He was on RyVent and it was filled by [facility]. Now it is not being filled by that pharmacy. He is no longer using a nasal spray. The nasal spray  did help BEFORE he started having the nose bleeds.   He reports that it does make a difference. He was wondering if it could be more effective. It is mostly throat issues that comes from behind the sinuses. He was using Flonase and it caused nose bleeds. He reports a lot of throat clearing as well. This is all clear.   He is a never smoker. There was discussion about going to see ENT but he never got around to it due to COVID 19. He has tried Xyzal, Zyrtec, Human resources officer. RyVent definitely works better than all of those. He is using it twice daily. He did have azelastine nasal spray which he did not think helped at all.   He had testing in January 2019 that was positive to trees and molds.  These were both intradermals.  There was only one mold mix.  Remains on Dexilant.  This seems to be controlling his reflux well.  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  Otherwise, there have been no changes to his past medical history, surgical history, family history, or social history.    Review of Systems  Constitutional: Negative.  Negative for chills, fever, malaise/fatigue and weight loss.  HENT: Negative for congestion, ear discharge, ear pain and sinus pain.        Positive for postnasal drip.  Positive for throat clearing.  Eyes: Negative for pain, discharge and redness.  Respiratory: Negative for cough, sputum production, shortness of breath and wheezing.   Cardiovascular: Negative.  Negative for chest pain and palpitations.  Gastrointestinal: Positive for heartburn. Negative for abdominal pain, constipation, diarrhea, nausea and vomiting.  Skin: Negative.  Negative for itching and rash.  Neurological: Negative for dizziness and headaches.  Endo/Heme/Allergies: Positive for environmental allergies. Does not bruise/bleed easily.       Objective:   Blood pressure 102/60, pulse 83, temperature 98.4 F (36.9 C), temperature source Temporal,  resp. rate 18, height 5\' 8"  (1.727 m), weight 178 lb 6.4 oz (80.9 kg), SpO2 96 %. Body mass index is 27.13 kg/m.   Physical Exam:  Physical Exam Constitutional:      Appearance: He is well-developed.     Comments: Very pleasant male.  HENT:     Head: Normocephalic and atraumatic.     Right Ear: Tympanic membrane, ear canal and external ear normal.     Left Ear: Tympanic membrane, ear canal and external ear normal.     Nose: Mucosal edema and rhinorrhea present. No nasal deformity, septal deviation or epistaxis.     Right Turbinates: Enlarged and swollen.     Left Turbinates: Enlarged and swollen.     Right Sinus: No maxillary sinus tenderness or frontal sinus tenderness.     Left Sinus: No maxillary sinus tenderness or frontal sinus tenderness.     Mouth/Throat:     Mouth: Oropharynx is clear and moist. Mucous membranes are not pale and not dry.     Pharynx: Uvula midline.     Comments: Mild to moderate cobblestoning. Eyes:     General:        Right eye: No discharge.        Left eye: No discharge.     Extraocular Movements: EOM normal.     Conjunctiva/sclera: Conjunctivae normal.     Right eye: Right conjunctiva is not injected. No chemosis.    Left eye: Left conjunctiva is not injected. No chemosis.    Pupils: Pupils are equal, round, and reactive to light.  Cardiovascular:     Rate and Rhythm: Normal rate and regular rhythm.     Heart sounds: Normal heart sounds.  Pulmonary:     Effort: Pulmonary effort is normal. No tachypnea, accessory muscle usage or respiratory distress.     Breath sounds: Normal breath sounds. No wheezing, rhonchi or rales.     Comments: Moving air well in all lung fields. Chest:     Chest wall: No tenderness.  Lymphadenopathy:     Cervical: No cervical adenopathy.  Skin:    Coloration: Skin is not pale.     Findings: No abrasion, erythema, petechiae or rash. Rash is not papular, urticarial or vesicular.  Neurological:     Mental Status: He is  alert.  Psychiatric:        Mood and Affect: Mood and affect normal.        Behavior: Behavior is cooperative.      Diagnostic studies: none     Salvatore Marvel, MD  [facility] [place] [facility] [place]

## 2020-11-23 NOTE — Patient Instructions (Addendum)
1. Perennial allergic rhinitis - We will increase your carbinoxamine 8mg  twice daily to help with drying up your nose/thorat. - Add on Xhance one spray per nostril once daily (this might be less likely to cause bloody noses).  - Call us in two weeks with how you are doing. - We could do allergy shots, but these are less effective with mold.   2. Return in about 3 months (around 02/20/2021).    Please inform us of any Emergency Department visits, hospitalizations, or changes in symptoms. Call us before going to the ED for breathing or allergy symptoms since we might be able to fit you in for a sick visit. Feel free to contact us anytime with any questions, problems, or concerns.  It was a pleasure to meet you today!  Websites that have reliable patient information: 1. American Academy of Asthma, Allergy, and Immunology: www.aaaai.org 2. Food Allergy Research and Education (FARE): foodallergy.org 3. Mothers of Asthmatics: http://www.asthmacommunitynetwork.org 4. American College of Allergy, Asthma, and Immunology: www.acaai.org   COVID-19 Vaccine Information can be found at: ShippingScam.co.uk For questions related to vaccine distribution or appointments, please email [email or web address] or call [phone].     "Like" [place] on Facebook and Instagram for our latest updates!       Make sure you are registered to vote! If you have moved or changed any of your contact information, you will need to get this updated before voting!  In some cases, you MAY be able to register to vote online: [email or web address]

## 2020-11-24 ENCOUNTER — Encounter: Payer: Self-pay | Admitting: Allergy & Immunology

## 2020-11-24 ENCOUNTER — Telehealth: Payer: Self-pay

## 2020-11-24 MED ORDER — CARBINOXAMINE MALEATE 4 MG PO TABS: 2.0000 | ORAL_TABLET | Freq: Two times a day (BID) | ORAL | 5 refills | Status: DC | PRN

## 2020-11-24 MED ORDER — XHANCE 93 MCG/ACT NA EXHU: 1.0000 | INHALANT_SUSPENSION | Freq: Every day | NASAL | 5 refills | Status: DC

## 2020-11-24 MED ORDER — DEXLANSOPRAZOLE 60 MG PO CPDR: 60.0000 mg | DELAYED_RELEASE_CAPSULE | Freq: Every day | ORAL | 5 refills | Status: DC

## 2020-11-24 NOTE — Telephone Encounter (Signed)
Prior authorization for dexlansoprazole on covermymeds.

## 2020-11-25 NOTE — Telephone Encounter (Signed)
Resubmitted with new criteria.

## 2020-11-26 NOTE — Telephone Encounter (Signed)
Insurance denied coverage. Do you want to try for esomeprazole, omeprazole, pantoprazole. ??

## 2020-11-30 MED ORDER — PANTOPRAZOLE SODIUM 40 MG PO TBEC: 40.0000 mg | DELAYED_RELEASE_TABLET | Freq: Every day | ORAL | 5 refills | Status: DC

## 2020-11-30 NOTE — Addendum Note (Signed)
Addended by: Clovis Cao A on: 11/30/2020 01:58 PM   Modules accepted: Orders

## 2020-11-30 NOTE — Telephone Encounter (Signed)
Let's do pantoprazole 40mg  daily.  Salvatore Marvel, MD [facility]

## 2020-11-30 NOTE — Telephone Encounter (Signed)
Orders have been sent. Patient will be notified.

## 2021-02-22 ENCOUNTER — Encounter: Payer: Self-pay | Admitting: Allergy & Immunology

## 2021-02-22 ENCOUNTER — Other Ambulatory Visit: Payer: Self-pay

## 2021-02-22 ENCOUNTER — Ambulatory Visit (INDEPENDENT_AMBULATORY_CARE_PROVIDER_SITE_OTHER): Payer: PRIVATE HEALTH INSURANCE | Admitting: Allergy & Immunology

## 2021-02-22 DIAGNOSIS — K219 Gastro-esophageal reflux disease without esophagitis: Secondary | ICD-10-CM | POA: Diagnosis not present

## 2021-02-22 DIAGNOSIS — J3089 Other allergic rhinitis: Secondary | ICD-10-CM | POA: Diagnosis not present

## 2021-02-22 NOTE — Progress Notes (Signed)
RE: Daniel Cherry MRN: [id number] DOB: 09-10-1966 Date of Telemedicine Visit: 02/22/2021  Referring provider: Denita Lung, MD Primary care provider: Denita Lung, MD  Chief Complaint: Allergic Rhinitis  (Coughing and throat clearing )   Telemedicine Follow Up Visit via Telephone: I connected with Daniel Cherry for a follow up on 02/22/21 by telephone and verified that I am speaking with the correct person using two identifiers.   I discussed the limitations, risks, security and privacy concerns of performing an evaluation and management service by telephone and the availability of in person appointments. I also discussed with the patient that there may be a patient responsible charge related to this service. The patient expressed understanding and agreed to proceed.  Patient is at home.  Provider is at the office.  Visit start time: 4:08 PM Visit end time: 4:22 PM Insurance consent/check in by: [facility] Medical consent and medical assistant/nurse: Diandra  History of Present Illness:  He is a 55 y.o. male, who is being followed for allergic rhinitis as well as GERD. His previous allergy office visit was in February 2022 with myself.  At that visit, his allergic rhinitis symptoms were not controlled.  We increased his carbinoxamine to 80 mg twice daily and added on Xhance 1 spray per nostril once daily.  We also changed him to pantoprazole for control of his reflux.  Since last visit, he has done very well.  He uses the carbinoxamine as well as the Protonix and the [place].  He is the [place] for about 2 to 3 weeks and then stopped it.  He did not feel that it was adding anything.  However, the carbinoxamine on the Protonix were helpful.  He thinks that he has 3 more refills left, but he will have the pharmacy call us to check on him later.  He has never felt this good.  He is very happy with how well he is doing.  Everything is affordable.  He has no summer plans, but he is going  somewhere in the fall.  Otherwise, there have been no changes to his past medical history, surgical history, family history, or social history.  Assessment and Plan:  Daniel Cherry is a 55 y.o. male with:   Perennial and seasonal allergic rhinitis (mold, trees)  LPRD (laryngopharyngeal reflux disease)   He is doing very well on Protonix and carbinoxamine.  We elected to make any changes at this time.  We will see him again in 1 year or earlier if needed.  Diagnostics: None.  Medication List:  Current Outpatient Medications  Medication Sig Dispense Refill  . Carbinoxamine Maleate 4 MG TABS Take 2 tablets (8 mg total) by mouth 2 (two) times daily as needed. 120 tablet 5  . finasteride (PROSCAR) 5 MG tablet Take 1 tablet (5 mg total) by mouth daily. 90 tablet 3  . fluconazole (DIFLUCAN) 150 MG tablet Take by mouth.    [place] Kitchen LORazepam (ATIVAN) 1 MG tablet Take 0.5-1 mg by mouth 2 (two) times daily.    . mirtazapine (REMERON) 30 MG tablet Take 60 mg by mouth at bedtime.  2  . pantoprazole (PROTONIX) 40 MG tablet Take 1 tablet (40 mg total) by mouth daily. 30 tablet 5  . QUEtiapine Fumarate (SEROQUEL XR) 150 MG 24 hr tablet Take 300 mg by mouth at bedtime.    . rosuvastatin (CRESTOR) 10 MG tablet Take 1 tablet (10 mg total) by mouth daily. 90 tablet 3  . vortioxetine HBr (TRINTELLIX) 20 MG  TABS tablet TAKE 1 TABLET BY MOUTH EVERY DAY    . amoxicillin (AMOXIL) 875 MG tablet Take 1 tablet (875 mg total) by mouth 2 (two) times daily. (Patient not taking: Reported on 02/22/2021) 20 tablet 0  . dexlansoprazole (DEXILANT) 60 MG capsule Take 1 capsule (60 mg total) by mouth daily. (Patient not taking: Reported on 02/22/2021) 30 capsule 5  . Fluticasone Propionate (XHANCE) 93 MCG/ACT EXHU Place 1 spray into the nose daily. (Patient not taking: Reported on 02/22/2021) 16 mL 5   Current Facility-Administered Medications  Medication Dose Route Frequency Provider Last Rate Last Admin  . 0.9 %  sodium chloride  infusion  500 mL Intravenous Continuous Armbruster, Carlota Raspberry, MD       Allergies: No Known Allergies I reviewed his past medical history, social history, family history, and environmental history and no significant changes have been reported from previous visits.  Review of Systems  Constitutional: Negative for chills, diaphoresis, fatigue and fever.  HENT: Negative for congestion, ear discharge, ear pain, facial swelling, postnasal drip, rhinorrhea, sinus pressure, sinus pain and sore throat.   Eyes: Negative for pain, discharge and itching.  Respiratory: Negative for apnea, cough, chest tightness and shortness of breath.   Cardiovascular: Negative for chest pain.  Gastrointestinal: Negative for diarrhea and nausea.  Musculoskeletal: Negative for arthralgias and myalgias.  Skin: Negative for rash.  Allergic/Immunologic: Negative for environmental allergies and food allergies.    Objective:  Physical exam not obtained as encounter was done via telephone.   Previous notes and tests were reviewed.  I discussed the assessment and treatment plan with the patient. The patient was provided an opportunity to ask questions and all were answered. The patient agreed with the plan and demonstrated an understanding of the instructions.   The patient was advised to call back or seek an in-person evaluation if the symptoms worsen or if the condition fails to improve as anticipated.  I provided 14 minutes of non-face-to-face time during this encounter.  It was my pleasure to participate in [place] care today. Please feel free to contact me with any questions or concerns.   Sincerely,  Valentina Shaggy, MD

## 2021-05-09 ENCOUNTER — Other Ambulatory Visit: Payer: Self-pay | Admitting: Allergy & Immunology

## 2021-06-07 ENCOUNTER — Other Ambulatory Visit: Payer: Self-pay | Admitting: Family Medicine

## 2021-06-07 ENCOUNTER — Other Ambulatory Visit: Payer: Self-pay | Admitting: Allergy & Immunology

## 2021-06-07 DIAGNOSIS — N400 Enlarged prostate without lower urinary tract symptoms: Secondary | ICD-10-CM

## 2021-06-07 DIAGNOSIS — E782 Mixed hyperlipidemia: Secondary | ICD-10-CM

## 2021-06-30 ENCOUNTER — Other Ambulatory Visit: Payer: Self-pay

## 2021-06-30 ENCOUNTER — Ambulatory Visit (INDEPENDENT_AMBULATORY_CARE_PROVIDER_SITE_OTHER): Payer: No Typology Code available for payment source | Admitting: Family Medicine

## 2021-06-30 VITALS — BP 98/64 | HR 78 | Temp 96.6°F | Wt 173.2 lb

## 2021-06-30 DIAGNOSIS — K219 Gastro-esophageal reflux disease without esophagitis: Secondary | ICD-10-CM | POA: Diagnosis not present

## 2021-06-30 DIAGNOSIS — E785 Hyperlipidemia, unspecified: Secondary | ICD-10-CM | POA: Diagnosis not present

## 2021-06-30 DIAGNOSIS — N400 Enlarged prostate without lower urinary tract symptoms: Secondary | ICD-10-CM

## 2021-06-30 DIAGNOSIS — Z8249 Family history of ischemic heart disease and other diseases of the circulatory system: Secondary | ICD-10-CM

## 2021-06-30 DIAGNOSIS — E782 Mixed hyperlipidemia: Secondary | ICD-10-CM

## 2021-06-30 DIAGNOSIS — J309 Allergic rhinitis, unspecified: Secondary | ICD-10-CM

## 2021-06-30 DIAGNOSIS — Z86010 Personal history of colonic polyps: Secondary | ICD-10-CM

## 2021-06-30 MED ORDER — ROSUVASTATIN CALCIUM 10 MG PO TABS: 10.0000 mg | ORAL_TABLET | Freq: Every day | ORAL | 3 refills | Status: DC

## 2021-06-30 MED ORDER — FINASTERIDE 5 MG PO TABS: 5.0000 mg | ORAL_TABLET | Freq: Every day | ORAL | 3 refills | Status: DC

## 2021-06-30 NOTE — Patient Instructions (Signed)
Try Axid, Tagamet, Pepcid etc. and see if they work.  You can also double the dose

## 2021-06-30 NOTE — Progress Notes (Signed)
   Subjective:    Patient ID: Daniel Cherry, male    DOB: 01-Feb-1966, 55 y.o.   MRN: [id number]  HPI He is here for an interval evaluation.  He does have a history of reflux disease as well as allergies and is having a PPI prescribed by his allergist as well as his allergy medicines.  Continues to be followed by Dr. Albertine Patricia for his underlying depression and is doing quite nicely on his present medication regimen.  Finasteride is helping with his prostate related symptoms as well as hair growth.  Does have a family history of heart disease and is taking Crestor with no difficulty.  His work and home life are going well.  He does have a history of colonic polyps and is scheduled for repeat colonoscopy next year.  Review of Systems     Objective:   Physical Exam Alert and in no distress otherwise not examined       Assessment & Plan:  Family history of heart disease in male family member before age 45  Allergic rhinitis, unspecified seasonality, unspecified trigger  Gastroesophageal reflux disease without esophagitis  Hyperlipidemia LDL goal <100  History of colonic polyps  BPH (benign prostatic hyperplasia)  Mixed hyperlipidemia - past due for recheck. Counseled re: genetic factors that he has no control over. Return to check lipids when fasting  Hx of adenomatous colonic polyps He will continue on his present medication regimen.  Discussed possibly switching to an H2 inhibitor instead of a PPI to help reduce potential problems.

## 2021-07-01 LAB — CBC WITH DIFFERENTIAL/PLATELET
Basophils Absolute: 0 10*3/uL (ref 0.0–0.2)
Basos: 1 %
EOS (ABSOLUTE): 0.1 10*3/uL (ref 0.0–0.4)
Eos: 2 %
Hematocrit: 42 % (ref 37.5–51.0)
Hemoglobin: 14.3 g/dL (ref 13.0–17.7)
Immature Grans (Abs): 0 10*3/uL (ref 0.0–0.1)
Immature Granulocytes: 0 %
Lymphocytes Absolute: 1.7 10*3/uL (ref 0.7–3.1)
Lymphs: 29 %
MCH: 28.6 pg (ref 26.6–33.0)
MCHC: 34 g/dL (ref 31.5–35.7)
MCV: 84 fL (ref 79–97)
Monocytes Absolute: 0.5 10*3/uL (ref 0.1–0.9)
Monocytes: 8 %
Neutrophils Absolute: 3.4 10*3/uL (ref 1.4–7.0)
Neutrophils: 60 %
Platelets: 229 10*3/uL (ref 150–450)
RBC: 5 x10E6/uL (ref 4.14–5.80)
RDW: 12.7 % (ref 11.6–15.4)
WBC: 5.7 10*3/uL (ref 3.4–10.8)

## 2021-07-01 LAB — COMPREHENSIVE METABOLIC PANEL
ALT: 18 IU/L (ref 0–44)
AST: 24 IU/L (ref 0–40)
Albumin/Globulin Ratio: 2.3 — ABNORMAL HIGH (ref 1.2–2.2)
Albumin: 5 g/dL — ABNORMAL HIGH (ref 3.8–4.9)
Alkaline Phosphatase: 77 IU/L (ref 44–121)
BUN/Creatinine Ratio: 9 (ref 9–20)
BUN: 10 mg/dL (ref 6–24)
Bilirubin Total: 0.4 mg/dL (ref 0.0–1.2)
CO2: 26 mmol/L (ref 20–29)
Calcium: 9.9 mg/dL (ref 8.7–10.2)
Chloride: 103 mmol/L (ref 96–106)
Creatinine, Ser: 1.06 mg/dL (ref 0.76–1.27)
Globulin, Total: 2.2 g/dL (ref 1.5–4.5)
Glucose: 87 mg/dL (ref 65–99)
Potassium: 4.8 mmol/L (ref 3.5–5.2)
Sodium: 142 mmol/L (ref 134–144)
Total Protein: 7.2 g/dL (ref 6.0–8.5)
eGFR: 82 mL/min/{1.73_m2} (ref >59)

## 2021-07-01 LAB — LIPID PANEL
Chol/HDL Ratio: 2.5 ratio (ref 0.0–5.0)
Cholesterol, Total: 116 mg/dL (ref 100–199)
HDL: 47 mg/dL (ref >39)
LDL Chol Calc (NIH): 50 mg/dL (ref 0–99)
Triglycerides: 105 mg/dL (ref 0–149)
VLDL Cholesterol Cal: 19 mg/dL (ref 5–40)

## 2021-07-05 ENCOUNTER — Other Ambulatory Visit: Payer: Self-pay | Admitting: Allergy & Immunology

## 2021-08-01 ENCOUNTER — Other Ambulatory Visit: Payer: Self-pay | Admitting: Allergy & Immunology

## 2021-08-02 ENCOUNTER — Other Ambulatory Visit: Payer: Self-pay | Admitting: Allergy & Immunology

## 2021-08-02 NOTE — Telephone Encounter (Signed)
Fill this request with 5 refills

## 2021-08-09 HISTORY — PX: SKIN BIOPSY: SHX1

## 2021-08-16 ENCOUNTER — Telehealth: Payer: Self-pay | Admitting: Family Medicine

## 2021-08-16 MED ORDER — OSELTAMIVIR PHOSPHATE 75 MG PO CAPS: 75.0000 mg | ORAL_CAPSULE | Freq: Every day | ORAL | 0 refills | Status: DC

## 2021-08-16 NOTE — Telephone Encounter (Signed)
Pt called his niece that lives with them was diagnosed with the flu today , type A He wants to know if he can get Tamiflu as preventative  [facility] [place]  Please advise patient

## 2021-08-16 NOTE — Telephone Encounter (Signed)
He has been exposed to the flu and would like preventative medication.  I will therefore call in Tamiflu.

## 2021-08-16 NOTE — Telephone Encounter (Signed)
Pt changed to [place]  for Tamiflu RX

## 2021-09-04 ENCOUNTER — Other Ambulatory Visit: Payer: Self-pay | Admitting: Allergy & Immunology

## 2021-09-21 ENCOUNTER — Encounter: Payer: Self-pay | Admitting: Family Medicine

## 2021-11-07 ENCOUNTER — Other Ambulatory Visit: Payer: Self-pay | Admitting: Allergy & Immunology

## 2021-12-06 ENCOUNTER — Other Ambulatory Visit: Payer: Self-pay | Admitting: Allergy & Immunology

## 2021-12-19 ENCOUNTER — Encounter: Payer: Self-pay | Admitting: Gastroenterology

## 2022-02-10 ENCOUNTER — Other Ambulatory Visit: Payer: Self-pay | Admitting: Allergy & Immunology

## 2022-02-15 ENCOUNTER — Other Ambulatory Visit: Payer: Self-pay | Admitting: Allergy & Immunology

## 2022-03-16 ENCOUNTER — Other Ambulatory Visit: Payer: Self-pay | Admitting: Allergy & Immunology

## 2022-03-29 ENCOUNTER — Encounter: Payer: Self-pay | Admitting: Gastroenterology

## 2022-04-15 ENCOUNTER — Other Ambulatory Visit: Payer: Self-pay | Admitting: Allergy & Immunology

## 2022-04-19 NOTE — Progress Notes (Signed)
[place] [address] Dept: [phone]  FOLLOW UP NOTE  Patient ID: Daniel Cherry, male    DOB: April 28, 1966  Age: 56 y.o. MRN: [id number] Date of Office Visit: 04/20/2022  Assessment  Chief Complaint: Follow-up  HPI Daniel Cherry is a 56 year old male who presents to the clinic for follow-up visit.  He was last seen in this clinic on 02/22/2021 via televisit by Dr. Ernst Bowler for evaluation of allergic rhinitis and reflux.  At today's visit, he reports his allergies have been moderately well controlled with postnasal drainage as intermittent postnasal drainage as the main symptom.  He continues carbinoxamine 4 mg 2 tablets twice a day and is not currently using Flonase or nasal saline rinses. His last environmental allergy skin testing was on 11/20/2017 and was positive to mold and tree pollen.  Reflux is reported as well controlled with no symptoms including heartburn or vomiting.  He continues pantoprazole 40 mg once a day which he takes 30 minutes before his first meal.  His current medications are listed in the chart.   Drug Allergies:  No Known Allergies  Physical Exam: BP 128/88   Pulse 78   Temp 98.1 F (36.7 C) (Temporal)   Ht '5\' 8"'$  (1.727 m)   Wt 177 lb 9.6 oz (80.6 kg)   SpO2 96%   BMI 27.00 kg/m    Physical Exam Vitals reviewed.  Constitutional:      Appearance: Normal appearance.  HENT:     Head: Normocephalic and atraumatic.     Right Ear: Tympanic membrane normal.     Left Ear: Tympanic membrane normal.     Nose:     Comments: Bilateral nares slightly erythematous with clear nasal drainage noted.  Pharynx normal.  Ears normal.  Eyes normal.    Mouth/Throat:     Pharynx: Oropharynx is clear.  Eyes:     Conjunctiva/sclera: Conjunctivae normal.  Cardiovascular:     Rate and Rhythm: Normal rate and regular rhythm.     Heart sounds: Normal heart sounds. No murmur heard. Pulmonary:     Effort: Pulmonary effort is normal.     Breath sounds:  Normal breath sounds.     Comments: Lungs clear to auscultation Musculoskeletal:        General: Normal range of motion.     Cervical back: Normal range of motion and neck supple.  Skin:    General: Skin is warm and dry.  Neurological:     Mental Status: He is alert and oriented to person, place, and time.  Psychiatric:        Mood and Affect: Mood normal.        Behavior: Behavior normal.        Thought Content: Thought content normal.        Judgment: Judgment normal.     Assessment and Plan: 1. Perennial allergic rhinitis   2. LPRD (laryngopharyngeal reflux disease)     Meds ordered this encounter  Medications   Carbinoxamine Maleate 4 MG TABS    Sig: Take 2 tablets up to twice a day as needed for nasal symptoms    Dispense:  120 tablet    Refill:  5   fluticasone (FLONASE) 50 MCG/ACT nasal spray    Sig: Place 2 sprays into both nostrils daily.    Dispense:  16 g    Refill:  5   pantoprazole (PROTONIX) 40 MG tablet    Sig: Take 1 tablet (40 mg total) by  mouth daily.    Dispense:  90 tablet    Refill:  3    Patient Instructions  Allergic rhinitis Continue allergen avoidance measures directed toward mold and tree pollen as listed below Continue carbinoxamine 4 mg tablets. Take 2 tablets up to twice a day as needed for nasal symptoms. In case you need to rotate to a different antihistamine, some examples of over the counter antihistamines include Zyrtec (cetirizine), Xyzal (levocetirizine), Allegra (fexofenadine), and Claritin (loratidine).  Continue Flonase 2 sprays in each nostril once a day as needed for stuffy nose Consider saline nasal rinses as needed for nasal symptoms. Use this before any medicated nasal sprays for best result  Reflux Continue dietary lifestyle modifications as listed below Continue pantoprazole 40 mg once a day.  Take this medication 30 minutes before your first meal of the day for best results  Call the clinic if this treatment plan is not  working well for you.  Follow up in 1 year or sooner if needed.   Return in about 1 year (around 04/21/2023), or if symptoms worsen or fail to improve.    Thank you for the opportunity to care for this patient.  Please do not hesitate to contact me with questions.  Gareth Morgan, FNP [facility]

## 2022-04-19 NOTE — Patient Instructions (Signed)
Allergic rhinitis Continue allergen avoidance measures directed toward mold and tree pollen as listed below Continue carbinoxamine 4 mg tablets. Take 2 tablets up to twice a day as needed for nasal symptoms. In case you need to rotate to a different antihistamine, some examples of over the counter antihistamines include Zyrtec (cetirizine), Xyzal (levocetirizine), Allegra (fexofenadine), and Claritin (loratidine).  Continue Flonase 2 sprays in each nostril once a day as needed for stuffy nose Consider saline nasal rinses as needed for nasal symptoms. Use this before any medicated nasal sprays for best result  Reflux Continue dietary lifestyle modifications as listed below Continue pantoprazole 40 mg once a day.  Take this medication 30 minutes before your first meal of the day for best results  Call the clinic if this treatment plan is not working well for you.  Follow up in 1 year or sooner if needed.  Reducing Pollen Exposure The American Academy of Allergy, Asthma and Immunology suggests the following steps to reduce your exposure to pollen during allergy seasons. Do not hang sheets or clothing out to dry; pollen may collect on these items. Do not mow lawns or spend time around freshly cut grass; mowing stirs up pollen. Keep windows closed at night.  Keep car windows closed while driving. Minimize morning activities outdoors, a time when pollen counts are usually at their highest. Stay indoors as much as possible when pollen counts or humidity is high and on windy days when pollen tends to remain in the air longer. Use air conditioning when possible.  Many air conditioners have filters that trap the pollen spores. Use a HEPA room air filter to remove pollen form the indoor air you breathe.  Control of Mold Allergen Mold and fungi can grow on a variety of surfaces provided certain temperature and moisture conditions exist.  Outdoor molds grow on plants, decaying vegetation and soil.  The  major outdoor mold, Alternaria and Cladosporium, are found in very high numbers during hot and dry conditions.  Generally, a late Summer - Fall peak is seen for common outdoor fungal spores.  Rain will temporarily lower outdoor mold spore count, but counts rise rapidly when the rainy period ends.  The most important indoor molds are Aspergillus and Penicillium.  Dark, humid and poorly ventilated basements are ideal sites for mold growth.  The next most common sites of mold growth are the bathroom and the kitchen.  Outdoor [employer] Use air conditioning and keep windows closed Avoid exposure to decaying vegetation. Avoid leaf raking. Avoid grain handling. Consider wearing a face mask if working in moldy areas.  Indoor Mold Control Maintain humidity below 50%. Clean washable surfaces with 5% bleach solution. Remove sources e.g. Contaminated carpets.   Lifestyle Changes for Controlling GERD When you have GERD, stomach acid feels as if it's backing up toward your mouth. Whether or not you take medication to control your GERD, your symptoms can often be improved with lifestyle changes.   Raise Your Head Reflux is more likely to strike when you're lying down flat, because stomach fluid can flow backward more easily. Raising the head of your bed 4-6 inches can help. To do this: Slide blocks or books under the legs at the head of your bed. Or, place a wedge under the mattress. Many foam stores can make a suitable wedge for you. The wedge should run from your waist to the top of your head. Don't just prop your head on several pillows. This increases pressure on your stomach. It  can make GERD worse.  Watch Your Eating Habits Certain foods may increase the acid in your stomach or relax the lower esophageal sphincter, making GERD more likely. It's best to avoid the following: Coffee, tea, and carbonated drinks (with and without caffeine) Fatty, fried, or spicy food Mint, chocolate, onions,  and tomatoes Any other foods that seem to irritate your stomach or cause you pain  Relieve the Pressure Eat smaller meals, even if you have to eat more often. Don't lie down right after you eat. Wait a few hours for your stomach to empty. Avoid tight belts and tight-fitting clothes. Lose excess weight.  Tobacco and Alcohol Avoid smoking tobacco and drinking alcohol. They can make GERD symptoms worse.

## 2022-04-20 ENCOUNTER — Encounter: Payer: Self-pay | Admitting: Family Medicine

## 2022-04-20 ENCOUNTER — Ambulatory Visit (INDEPENDENT_AMBULATORY_CARE_PROVIDER_SITE_OTHER): Payer: PRIVATE HEALTH INSURANCE | Admitting: Family Medicine

## 2022-04-20 VITALS — BP 128/88 | HR 78 | Temp 98.1°F | Ht 68.0 in | Wt 177.6 lb

## 2022-04-20 DIAGNOSIS — J3089 Other allergic rhinitis: Secondary | ICD-10-CM | POA: Diagnosis not present

## 2022-04-20 DIAGNOSIS — K219 Gastro-esophageal reflux disease without esophagitis: Secondary | ICD-10-CM

## 2022-04-20 MED ORDER — PANTOPRAZOLE SODIUM 40 MG PO TBEC: 40.0000 mg | DELAYED_RELEASE_TABLET | Freq: Every day | ORAL | 3 refills | Status: DC

## 2022-04-20 MED ORDER — FLUTICASONE PROPIONATE 50 MCG/ACT NA SUSP: 2.0000 | Freq: Every day | NASAL | 5 refills | Status: DC

## 2022-04-20 MED ORDER — CARBINOXAMINE MALEATE 4 MG PO TABS
ORAL_TABLET | ORAL | 5 refills | Status: DC
Start: 2022-04-20 — End: 2022-08-29

## 2022-05-13 ENCOUNTER — Other Ambulatory Visit: Payer: Self-pay | Admitting: Family Medicine

## 2022-05-13 DIAGNOSIS — N400 Enlarged prostate without lower urinary tract symptoms: Secondary | ICD-10-CM

## 2022-05-22 ENCOUNTER — Encounter: Payer: PRIVATE HEALTH INSURANCE | Admitting: Gastroenterology

## 2022-05-25 ENCOUNTER — Ambulatory Visit (AMBULATORY_SURGERY_CENTER): Payer: Self-pay

## 2022-05-25 VITALS — Ht 68.0 in | Wt 178.0 lb

## 2022-05-25 DIAGNOSIS — Z86010 Personal history of colonic polyps: Secondary | ICD-10-CM

## 2022-05-25 MED ORDER — NA SULFATE-K SULFATE-MG SULF 17.5-3.13-1.6 GM/177ML PO SOLN: 1.0000 | ORAL | 0 refills | Status: DC

## 2022-05-25 NOTE — Progress Notes (Signed)
No egg or soy allergy known to patient  No issues known to pt with past sedation with any surgeries or procedures Patient denies ever being told they had issues or difficulty with intubation  No FH of Malignant Hyperthermia Pt is not on diet pills Pt is not on  home 02  Pt is not on blood thinners  Pt denies issues with constipation  No A fib or A flutter Have any cardiac testing pending--denied Pt instructed to use Singlecare.com or GoodRx for a price reduction on prep   

## 2022-06-01 ENCOUNTER — Ambulatory Visit: Payer: PRIVATE HEALTH INSURANCE | Admitting: Family Medicine

## 2022-06-11 ENCOUNTER — Other Ambulatory Visit: Payer: Self-pay | Admitting: Family Medicine

## 2022-06-11 DIAGNOSIS — N400 Enlarged prostate without lower urinary tract symptoms: Secondary | ICD-10-CM

## 2022-06-14 ENCOUNTER — Encounter: Payer: Self-pay | Admitting: Gastroenterology

## 2022-06-19 ENCOUNTER — Telehealth: Payer: Self-pay | Admitting: Gastroenterology

## 2022-06-19 NOTE — Telephone Encounter (Signed)
Inbound call from patient stating he ate fiber gummies on Friday and also consumed a recipe Saturday that contained nuts. Patient has an upcoming procedure 06/21/22. Please give a call to further advise.  Thank you

## 2022-06-19 NOTE — Telephone Encounter (Signed)
Patient notified to avoid the items from now to procedure date and to stay hydrated starting to today to help flush the colon. Pt verbalizes understanding.

## 2022-06-21 ENCOUNTER — Ambulatory Visit (AMBULATORY_SURGERY_CENTER): Payer: PRIVATE HEALTH INSURANCE | Admitting: Gastroenterology

## 2022-06-21 ENCOUNTER — Encounter: Payer: Self-pay | Admitting: Gastroenterology

## 2022-06-21 VITALS — BP 113/84 | HR 63 | Temp 97.8°F | Resp 12 | Ht 68.0 in | Wt 178.0 lb

## 2022-06-21 DIAGNOSIS — D122 Benign neoplasm of ascending colon: Secondary | ICD-10-CM | POA: Diagnosis not present

## 2022-06-21 DIAGNOSIS — Z09 Encounter for follow-up examination after completed treatment for conditions other than malignant neoplasm: Secondary | ICD-10-CM | POA: Diagnosis present

## 2022-06-21 DIAGNOSIS — D120 Benign neoplasm of cecum: Secondary | ICD-10-CM

## 2022-06-21 DIAGNOSIS — D125 Benign neoplasm of sigmoid colon: Secondary | ICD-10-CM

## 2022-06-21 DIAGNOSIS — Z86010 Personal history of colonic polyps: Secondary | ICD-10-CM

## 2022-06-21 DIAGNOSIS — K635 Polyp of colon: Secondary | ICD-10-CM | POA: Diagnosis not present

## 2022-06-21 MED ORDER — SODIUM CHLORIDE 0.9 % IV SOLN: 500.0000 mL | Freq: Once | INTRAVENOUS | Status: DC

## 2022-06-21 NOTE — Patient Instructions (Signed)
Please read handouts provided. Continue present medications. Await pathology results.   YOU HAD AN ENDOSCOPIC PROCEDURE TODAY AT THE [facility]:   Refer to the procedure report that was given to you for any specific questions about what was found during the examination.  If the procedure report does not answer your questions, please call your gastroenterologist to clarify.  If you requested that your care partner not be given the details of your procedure findings, then the procedure report has been included in a sealed envelope for you to review at your convenience later.  YOU SHOULD EXPECT: Some feelings of bloating in the abdomen. Passage of more gas than usual.  Walking can help get rid of the air that was put into your GI tract during the procedure and reduce the bloating. If you had a lower endoscopy (such as a colonoscopy or flexible sigmoidoscopy) you may notice spotting of blood in your stool or on the toilet paper. If you underwent a bowel prep for your procedure, you may not have a normal bowel movement for a few days.  Please Note:  You might notice some irritation and congestion in your nose or some drainage.  This is from the oxygen used during your procedure.  There is no need for concern and it should clear up in a day or so.  SYMPTOMS TO REPORT IMMEDIATELY:  Following lower endoscopy (colonoscopy or flexible sigmoidoscopy):  Excessive amounts of blood in the stool  Significant tenderness or worsening of abdominal pains  Swelling of the abdomen that is new, acute  Fever of 100F or higher  For urgent or emergent issues, a gastroenterologist can be reached at any hour by calling [phone]. Do not use MyChart messaging for urgent concerns.    DIET:  We do recommend a small meal at first, but then you may proceed to your regular diet.  Drink plenty of fluids but you should avoid alcoholic beverages for 24 hours.  ACTIVITY:  You should plan to take it easy for  the rest of today and you should NOT DRIVE or use heavy machinery until tomorrow (because of the sedation medicines used during the test).    FOLLOW UP: Our staff will call the number listed on your records the next business day following your procedure.  We will call around 7:15- 8:00 am to check on you and address any questions or concerns that you may have regarding the information given to you following your procedure. If we do not reach you, we will leave a message.  If you develop any symptoms (ie: fever, flu-like symptoms, shortness of breath, cough etc.) before then, please call [phone].  If you test positive for Covid 19 in the 2 weeks post procedure, please call and report this information to us.    If any biopsies were taken you will be contacted by phone or by letter within the next 1-3 weeks.  Please call us at [phone] if you have not heard about the biopsies in 3 weeks.    SIGNATURES/CONFIDENTIALITY: You and/or your care partner have signed paperwork which will be entered into your electronic medical record.  These signatures attest to the fact that that the information above on your After Visit Summary has been reviewed and is understood.  Full responsibility of the confidentiality of this discharge information lies with you and/or your care-partner.  

## 2022-06-21 NOTE — Progress Notes (Signed)
Called to room to assist during endoscopic procedure.  Patient ID and intended procedure confirmed with present staff. Received instructions for my participation in the procedure from the performing physician.  

## 2022-06-21 NOTE — Progress Notes (Signed)
Report to PACU, RN, vss, BBS= Clear.  

## 2022-06-21 NOTE — Progress Notes (Signed)
VS by DT  Pt's states no medical or surgical changes since previsit or office visit.  

## 2022-06-21 NOTE — Progress Notes (Signed)
[facility] History and Physical   Primary Care Physician:  Denita Lung, MD   Reason for Procedure:   History of colon polyps  Plan:    colonoscopy     HPI: Daniel Cherry is a 56 y.o. male  here for colonoscopy surveillance - last exam 10/2016 - 4 polyps, at least a few adenomas. Patient denies any bowel symptoms at this time. No family history of colon cancer known. Otherwise feels well without any cardiopulmonary symptoms.   I have discussed risks / benefits of anesthesia and endoscopic procedure with [name] and they wish to proceed with the exams as outlined today.    Past Medical History:  Diagnosis Date   Allergy    Anxiety    Depression    Hyperlipidemia     Past Surgical History:  Procedure Laterality Date   COLONOSCOPY     DERMABRASION  1986   SKIN BIOPSY Left 08/09/2021   dysplastic compound neus with severe atypia   SKIN BIOPSY     severe dysplastic nevus    Prior to Admission medications   Medication Sig Start Date End Date Taking? Authorizing Provider  Carbinoxamine Maleate 4 MG TABS Take 2 tablets up to twice a day as needed for nasal symptoms 04/20/22  Yes Ambs, Kathrine Cords, FNP  finasteride (PROSCAR) 5 MG tablet TAKE 1 TABLET (5 MG TOTAL) BY MOUTH DAILY. 06/12/22  Yes Denita Lung, MD  LORazepam (ATIVAN) 1 MG tablet Take 0.5-1 mg by mouth 2 (two) times daily. 02/08/21  Yes [provider]  mirtazapine (REMERON) 30 MG tablet Take 60 mg by mouth at bedtime. 07/30/18  Yes [provider]  pantoprazole (PROTONIX) 40 MG tablet Take 1 tablet (40 mg total) by mouth daily. 04/20/22  Yes Ambs, Kathrine Cords, FNP  QUEtiapine (SEROQUEL XR) 400 MG 24 hr tablet Take 400 mg by mouth at bedtime. 04/15/22  Yes [provider]  rosuvastatin (CRESTOR) 10 MG tablet Take 1 tablet (10 mg total) by mouth daily. 06/30/21  Yes Denita Lung, MD  vortioxetine HBr (TRINTELLIX) 20 MG TABS tablet TAKE 1 TABLET BY MOUTH EVERY DAY 03/16/17  Yes  [provider]    Current Outpatient Medications  Medication Sig Dispense Refill   Carbinoxamine Maleate 4 MG TABS Take 2 tablets up to twice a day as needed for nasal symptoms 120 tablet 5   finasteride (PROSCAR) 5 MG tablet TAKE 1 TABLET (5 MG TOTAL) BY MOUTH DAILY. 30 tablet 1   LORazepam (ATIVAN) 1 MG tablet Take 0.5-1 mg by mouth 2 (two) times daily.     mirtazapine (REMERON) 30 MG tablet Take 60 mg by mouth at bedtime.  2   pantoprazole (PROTONIX) 40 MG tablet Take 1 tablet (40 mg total) by mouth daily. 90 tablet 3   QUEtiapine (SEROQUEL XR) 400 MG 24 hr tablet Take 400 mg by mouth at bedtime.     rosuvastatin (CRESTOR) 10 MG tablet Take 1 tablet (10 mg total) by mouth daily. 90 tablet 3   vortioxetine HBr (TRINTELLIX) 20 MG TABS tablet TAKE 1 TABLET BY MOUTH EVERY DAY     Current Facility-Administered Medications  Medication Dose Route Frequency Provider Last Rate Last Admin   0.9 %  sodium chloride infusion  500 mL Intravenous Once Julieana Eshleman, Carlota Raspberry, MD        Allergies as of 06/21/2022   (No Known Allergies)    Family History  Problem Relation Age of Onset   Hyperlipidemia  Mother    Dementia Mother 64   Colon polyps Father    Heart disease Father    Hypertension Father    Hyperlipidemia Father    Heart disease Sister    Hyperlipidemia Sister    Heart disease Brother    Hyperlipidemia Brother    Cancer - Prostate Maternal Uncle    Cancer - Prostate Paternal Uncle    Colon cancer Neg Hx    Esophageal cancer Neg Hx    Stomach cancer Neg Hx    Rectal cancer Neg Hx    Allergic rhinitis Neg Hx    Angioedema Neg Hx    Asthma Neg Hx    Atopy Neg Hx    Eczema Neg Hx    Immunodeficiency Neg Hx    Urticaria Neg Hx     Social History   Socioeconomic History   Marital status: Single    Spouse name: Not on file   Number of children: 0   Years of education: 16   Highest education level: Not on file  Occupational History    Comment: SW, [employer]  Tobacco Use   Smoking status: Never   Smokeless tobacco: Never  Vaping Use   Vaping Use: Never used  Substance and Sexual Activity   Alcohol use: Yes    Alcohol/week: 1.0 standard drink of alcohol    Types: 1 Glasses of wine per week    Comment: 2-3 monthly   Drug use: No   Sexual activity: Yes  Other Topics Concern   Not on file  Social History Narrative   Lives with partner   Caffeine- sodas, 1-2 daily   Social Determinants of Health   Financial Resource Strain: Not on file  Food Insecurity: Not on file  Transportation Needs: Not on file  Physical Activity: Not on file  Stress: Not on file  Social Connections: Not on file  Intimate Partner Violence: Not on file    Review of Systems: All other review of systems negative except as mentioned in the HPI.  Physical Exam: Vital signs BP 125/81   Pulse 72   Temp 97.8 F (36.6 C)   Resp 11   Ht '5\' 8"'$  (1.727 m)   Wt 178 lb (80.7 kg)   SpO2 97%   BMI 27.06 kg/m   General:   Alert,  Well-developed, pleasant and cooperative in NAD Lungs:  Clear throughout to auscultation.   Heart:  Regular rate and rhythm Abdomen:  Soft, nontender and nondistended.   Neuro/Psych:  Alert and cooperative. Normal mood and affect. A and O x 3  [name], MD [facility]

## 2022-06-21 NOTE — Op Note (Addendum)
[place] Patient Name: Daniel Cherry Procedure Date: 06/21/2022 9:02 AM MRN: [id number] Endoscopist: Remo Lipps P. Havery Moros , MD Age: 56 Referring MD:  Date of Birth: 1966/06/19 Gender: Male Account #: [id number] Procedure:                Colonoscopy Indications:              High risk colon cancer surveillance: Personal                            history of colonic polyps - adenomas removed 10/2016 Medicines:                Monitored Anesthesia Care Procedure:                Pre-Anesthesia Assessment:                           - Prior to the procedure, a History and Physical                            was performed, and patient medications and                            allergies were reviewed. The patient's tolerance of                            previous anesthesia was also reviewed. The risks                            and benefits of the procedure and the sedation                            options and risks were discussed with the patient.                            All questions were answered, and informed consent                            was obtained. Prior Anticoagulants: The patient has                            taken no previous anticoagulant or antiplatelet                            agents. ASA Grade Assessment: II - A patient with                            mild systemic disease. After reviewing the risks                            and benefits, the patient was deemed in                            satisfactory condition to undergo the procedure.  After obtaining informed consent, the colonoscope                            was passed under direct vision. Throughout the                            procedure, the patient's blood pressure, pulse, and                            oxygen saturations were monitored continuously. The                            Colonoscope was introduced through the anus and                            advanced to  the the cecum, identified by                            appendiceal orifice and ileocecal valve. The                            colonoscopy was performed without difficulty. The                            patient tolerated the procedure well. The quality                            of the bowel preparation was good. The ileocecal                            valve, appendiceal orifice, and rectum were                            photographed. Scope In: 9:15:07 AM Scope Out: 9:38:07 AM Scope Withdrawal Time: 0 hours 19 minutes 58 seconds  Total Procedure Duration: 0 hours 23 minutes 0 seconds  Findings:                 The perianal and digital rectal examinations were                            normal.                           The ascending colon was tortuous with looping.                           A 3 mm polyp was found in the cecum. The polyp was                            sessile. The polyp was removed with a cold snare.                            Resection and retrieval were complete.  A diminutive polyp was found in the ascending                            colon. The polyp was sessile. The polyp was removed                            with a cold biopsy forceps as it could not be                            grasped with snare with multiple attempts.                            Resection and retrieval were complete.                           A 5 mm polyp was found in the sigmoid colon. The                            polyp was sessile. The polyp was removed with a                            cold snare. Resection and retrieval were complete.                           Internal hemorrhoids were found during                            retroflexion. The hemorrhoids were small.                           The exam was otherwise without abnormality. Complications:            No immediate complications. Estimated blood loss:                            Minimal. Estimated Blood  Loss:     Estimated blood loss was minimal. Impression:               - Tortuous right colon with looping.                           - One 3 mm polyp in the cecum, removed with a cold                            snare. Resected and retrieved.                           - One diminutive polyp in the ascending colon,                            removed with a cold biopsy forceps. Resected and                            retrieved.                           -  One 5 mm polyp in the sigmoid colon, removed with                            a cold snare. Resected and retrieved.                           - Internal hemorrhoids.                           - The examination was otherwise normal. Recommendation:           - Patient has a contact number available for                            emergencies. The signs and symptoms of potential                            delayed complications were discussed with the                            patient. Return to normal activities tomorrow.                            Written discharge instructions were provided to the                            patient.                           - Resume previous diet.                           - Continue present medications.                           - Await pathology results. Remo Lipps P. Havery Moros, MD 06/21/2022 9:42:12 AM This report has been signed electronically.

## 2022-06-22 ENCOUNTER — Telehealth: Payer: Self-pay | Admitting: Gastroenterology

## 2022-06-22 ENCOUNTER — Telehealth: Payer: Self-pay | Admitting: *Deleted

## 2022-06-22 NOTE — Telephone Encounter (Signed)
  Follow up Call-     06/21/2022    8:15 AM  Call back number  Post procedure Call Back phone  # [phone]  Permission to leave phone message Yes     Patient questions:  Message left to call us if necessary.

## 2022-06-22 NOTE — Telephone Encounter (Signed)
Patient called about abdominal discomfort and bloating.  Described it as discomfort not pain.  Recommended gas X to relieve the discomfort and bloating.  Reported dried blood in stool today and fresh blood when wiping.  Explained to patient the polyps removed likely left bloody residue in colon and when BM occurred it came out.  The red blood is likely from hemorrhoidal irritation.  Encouraged patient to call back if anything worsens or doesn't subside soon.  Patient verbalized understanding.

## 2022-06-22 NOTE — Telephone Encounter (Signed)
Returning follow up call. PT is experiencing stomach discomfort and bloating after colonoscopy yesterday. Please advise. Thank you

## 2022-06-28 ENCOUNTER — Encounter: Payer: Self-pay | Admitting: Internal Medicine

## 2022-07-06 ENCOUNTER — Encounter: Payer: Self-pay | Admitting: Family Medicine

## 2022-07-06 ENCOUNTER — Ambulatory Visit (INDEPENDENT_AMBULATORY_CARE_PROVIDER_SITE_OTHER): Payer: PRIVATE HEALTH INSURANCE | Admitting: Family Medicine

## 2022-07-06 VITALS — BP 100/68 | HR 77 | Temp 97.7°F | Ht 67.5 in | Wt 172.6 lb

## 2022-07-06 DIAGNOSIS — E782 Mixed hyperlipidemia: Secondary | ICD-10-CM

## 2022-07-06 DIAGNOSIS — Z0000 Encounter for general adult medical examination without abnormal findings: Secondary | ICD-10-CM | POA: Diagnosis not present

## 2022-07-06 DIAGNOSIS — N400 Enlarged prostate without lower urinary tract symptoms: Secondary | ICD-10-CM

## 2022-07-06 DIAGNOSIS — F339 Major depressive disorder, recurrent, unspecified: Secondary | ICD-10-CM

## 2022-07-06 DIAGNOSIS — Z23 Encounter for immunization: Secondary | ICD-10-CM

## 2022-07-06 DIAGNOSIS — J3089 Other allergic rhinitis: Secondary | ICD-10-CM

## 2022-07-06 DIAGNOSIS — E781 Pure hyperglyceridemia: Secondary | ICD-10-CM | POA: Diagnosis not present

## 2022-07-06 DIAGNOSIS — Z860101 Personal history of adenomatous and serrated colon polyps: Secondary | ICD-10-CM

## 2022-07-06 DIAGNOSIS — Z86010 Personal history of colonic polyps: Secondary | ICD-10-CM

## 2022-07-06 DIAGNOSIS — K219 Gastro-esophageal reflux disease without esophagitis: Secondary | ICD-10-CM

## 2022-07-06 DIAGNOSIS — Z8249 Family history of ischemic heart disease and other diseases of the circulatory system: Secondary | ICD-10-CM

## 2022-07-06 MED ORDER — ROSUVASTATIN CALCIUM 10 MG PO TABS: 10.0000 mg | ORAL_TABLET | Freq: Every day | ORAL | 3 refills | Status: DC

## 2022-07-06 MED ORDER — FINASTERIDE 5 MG PO TABS: 5.0000 mg | ORAL_TABLET | Freq: Every day | ORAL | 1 refills | Status: DC

## 2022-07-06 NOTE — Progress Notes (Signed)
Complete physical exam  Patient: Daniel Cherry   DOB: 04-21-1966   56 y.o. Male  MRN: [id number]  Subjective:    Chief Complaint  Patient presents with   Annual Exam    Fasting    Daniel Cherry is a 56 y.o. male who presents today for a complete physical exam. He reports consuming a general diet.  Staying active at least 20 minutes a day.  He generally feels well. He reports sleeping well.  He recently had a colonoscopy which did show colonic polyps and is scheduled for repeat in 5 years.  Apparently the colon was very tortuous and he did have several days of discomfort.  He continues on finasteride to help with his urologic symptoms and is doing well with that.  He does have reflux disease and does use Protonix regularly.  Continues to be followed by Dr. Albertine Patricia for his depression symptoms but apparently stopping one of the medications at her recommendation.  He continues on Crestor and having no difficulty with that.  His allergies seem to be under good control.  He is in a long-term relationship which is going well.  They are considering getting married but apparently do have the affairs in order to take care of any legal issues.  Otherwise his family and social history as well as health maintenance and immunizations was reviewed.  Most recent fall risk assessment:    05/14/2020    8:32 AM  Fall Risk   Falls in the past year? 0     Most recent depression screenings:    11/05/2020    9:22 AM 05/14/2020    8:32 AM  PHQ 2/9 Scores  PHQ - 2 Score 0 0      Patient Active Problem List   Diagnosis Date Noted   Hx of adenomatous colonic polyps 06/30/2021   Depression, recurrent ([place]) 05/14/2020   LPRD (laryngopharyngeal reflux disease) 05/14/2020   Perennial allergic rhinitis 06/05/2018   Mixed hyperlipidemia 08/11/2014   Hypertriglyceridemia 08/11/2014   Family history of heart disease in male family member before age 41 03/30/2011   BPH (benign prostatic hyperplasia) 03/30/2011    Past Medical History:  Diagnosis Date   Allergy    Anxiety    Depression    Hyperlipidemia    Past Surgical History:  Procedure Laterality Date   COLONOSCOPY     DERMABRASION  1986   SKIN BIOPSY Left 08/09/2021   dysplastic compound neus with severe atypia   SKIN BIOPSY     severe dysplastic nevus   Social History   Tobacco Use   Smoking status: Never   Smokeless tobacco: Never  Vaping Use   Vaping Use: Never used  Substance Use Topics   Alcohol use: Yes    Alcohol/week: 1.0 standard drink of alcohol    Types: 1 Glasses of wine per week    Comment: 2-3 monthly   Drug use: No   Family History  Problem Relation Age of Onset   Hyperlipidemia Mother    Dementia Mother 62   Colon polyps Father    Heart disease Father    Hypertension Father    Hyperlipidemia Father    Heart disease Sister    Hyperlipidemia Sister    Heart disease Brother    Hyperlipidemia Brother    Cancer - Prostate Maternal Uncle    Cancer - Prostate Paternal Uncle    Colon cancer Neg Hx    Esophageal cancer Neg Hx    Stomach cancer  Neg Hx    Rectal cancer Neg Hx    Allergic rhinitis Neg Hx    Angioedema Neg Hx    Asthma Neg Hx    Atopy Neg Hx    Eczema Neg Hx    Immunodeficiency Neg Hx    Urticaria Neg Hx    No Known Allergies    Patient Care Team: Denita Lung, MD as PCP - General (Family Medicine)   Outpatient Medications Prior to Visit  Medication Sig Note   Carbinoxamine Maleate 4 MG TABS Take 2 tablets up to twice a day as needed for nasal symptoms 07/06/2022: Prn last dose today   LORazepam (ATIVAN) 1 MG tablet Take 0.5-1 mg by mouth 2 (two) times daily.    mirtazapine (REMERON) 30 MG tablet Take 60 mg by mouth at bedtime.    pantoprazole (PROTONIX) 40 MG tablet Take 1 tablet (40 mg total) by mouth daily.    QUEtiapine (SEROQUEL XR) 400 MG 24 hr tablet Take 400 mg by mouth at bedtime.    vortioxetine HBr (TRINTELLIX) 20 MG TABS tablet TAKE 1 TABLET BY MOUTH EVERY DAY     [DISCONTINUED] finasteride (PROSCAR) 5 MG tablet TAKE 1 TABLET (5 MG TOTAL) BY MOUTH DAILY.    [DISCONTINUED] rosuvastatin (CRESTOR) 10 MG tablet Take 1 tablet (10 mg total) by mouth daily.    No facility-administered medications prior to visit.    Review of Systems  All other systems reviewed and are negative.         Objective:     BP 100/68   Pulse 77   Temp 97.7 F (36.5 C)   Ht 5' 7.5" (1.715 m)   Wt 172 lb 9.6 oz (78.3 kg)   SpO2 97%   BMI 26.63 kg/m  BP Readings from Last 3 Encounters:  07/06/22 100/68  06/21/22 113/84  04/20/22 128/88   Wt Readings from Last 3 Encounters:  07/06/22 172 lb 9.6 oz (78.3 kg)  06/21/22 178 lb (80.7 kg)  05/25/22 178 lb (80.7 kg)      Physical Exam  Alert and in no distress. Tympanic membranes and canals are normal. Pharyngeal area is normal. Neck is supple without adenopathy or thyromegaly. Cardiac exam shows a regular sinus rhythm without murmurs or gallops. Lungs are clear to auscultation.  Last CBC Lab Results  Component Value Date   WBC 5.7 06/30/2021   HGB 14.3 06/30/2021   HCT 42.0 06/30/2021   MCV 84 06/30/2021   MCH 28.6 06/30/2021   RDW 12.7 06/30/2021   PLT 229 33/00/7622   Last metabolic panel Lab Results  Component Value Date   GLUCOSE 87 06/30/2021   NA 142 06/30/2021   K 4.8 06/30/2021   CL 103 06/30/2021   CO2 26 06/30/2021   BUN 10 06/30/2021   CREATININE 1.06 06/30/2021   EGFR 82 06/30/2021   CALCIUM 9.9 06/30/2021   PROT 7.2 06/30/2021   ALBUMIN 5.0 (H) 06/30/2021   LABGLOB 2.2 06/30/2021   AGRATIO 2.3 (H) 06/30/2021   BILITOT 0.4 06/30/2021   ALKPHOS 77 06/30/2021   AST 24 06/30/2021   ALT 18 06/30/2021   Last lipids Lab Results  Component Value Date   CHOL 116 06/30/2021   HDL 47 06/30/2021   LDLCALC 50 06/30/2021   TRIG 105 06/30/2021   CHOLHDL 2.5 06/30/2021        Assessment & Plan:    Routine general medical examination at a health care facility - Plan: CBC with  Differential/Platelet, Comprehensive  metabolic panel  BPH (benign prostatic hyperplasia) - Plan: finasteride (PROSCAR) 5 MG tablet  Mixed hyperlipidemia - past due for recheck. Counseled re: genetic factors that he has no control over. Return to check lipids when fasting - Plan: rosuvastatin (CRESTOR) 10 MG tablet, Lipid panel  Hypertriglyceridemia - Plan: Lipid panel  Perennial allergic rhinitis - Plan: CBC with Differential/Platelet, Comprehensive metabolic panel  Depression, recurrent ([facility])  Hx of adenomatous colonic polyps - Plan: CBC with Differential/Platelet, Comprehensive metabolic panel  LPRD (laryngopharyngeal reflux disease)  Family history of heart disease in male family member before age 56 - Plan: Lipid panel  Need for Tdap vaccination - Plan: Tdap vaccine greater than or equal to 7yo IM  Need for influenza vaccination - Plan: Flu Vaccine QUAD 33moIM (Fluarix, Fluzone & Alfiuria Quad PF)  Immunization History  Administered Date(s) Administered   Influenza,inj,Quad PF,6+ Mos 10/12/2015, 09/28/2016, 09/04/2017, 08/31/2018   Influenza-Unspecified 09/07/2021   PFIZER(Purple Top)SARS-COV-2 Vaccination 01/13/2020, 02/03/2020, 09/04/2020   Tdap 07/11/2012   Zoster Recombinat (Shingrix) 01/13/2021, 04/20/2021    Health Maintenance  Topic Date Due   COVID-19 Vaccine (4 - Pfizer series) 10/30/2020   INFLUENZA VACCINE  05/23/2022   TETANUS/TDAP  07/11/2022   COLONOSCOPY (Pts 45-420yrInsurance coverage will need to be confirmed)  06/22/2027   Hepatitis C Screening  Completed   HIV Screening  Completed   Zoster Vaccines- Shingrix  Completed   HPV VACCINES  Aged Out    He will continue on his present medication regimen although I did recommend trying an H2 blocker to see if that would help for his reflux symptoms describing the potential problems with the PPI.  He is considering stopping one of the depression meds as per comments with Dr. StAlbertine Patricia Follow-up with GI as  needed.  Problem List Items Addressed This Visit     BPH (benign prostatic hyperplasia)   Relevant Medications   finasteride (PROSCAR) 5 MG tablet   [place], recurrent ([place]  Family history of heart disease in male family member before age 863 Relevant Orders   Lipid panel   Hx of adenomatous colonic polyps   Relevant Orders   CBC with Differential/Platelet   Comprehensive metabolic panel   Hypertriglyceridemia   Relevant Medications   rosuvastatin (CRESTOR) 10 MG tablet   Other Relevant Orders   Lipid panel   LPRD (laryngopharyngeal reflux disease)   Mixed hyperlipidemia   Relevant Medications   rosuvastatin (CRESTOR) 10 MG tablet   Other Relevant Orders   Lipid panel   Perennial allergic rhinitis   Relevant Orders   CBC with Differential/Platelet   Comprehensive metabolic panel   Other Visit Diagnoses     Routine general medical examination at a health care facility    -  Primary   Relevant Orders   CBC with Differential/Platelet   Comprehensive metabolic panel   Need for Tdap vaccination       Relevant Orders   Tdap vaccine greater than or equal to 7yo IM   Need for influenza vaccination       Relevant Orders   Flu Vaccine QUAD 73m42mo (Fluarix, Fluzone & Alfiuria Quad PF)      Return in about 1 year (around 07/07/2023) for fasting cpe .     JohJill AlexandersD

## 2022-07-06 NOTE — Patient Instructions (Addendum)
For your reflux try Zantac, Pepcid or Axid and if it works use it Corporate treasurer, Male Adopting a healthy lifestyle and getting preventive care are important in promoting health and wellness. Ask your health care provider about: The right schedule for you to have regular tests and exams. Things you can do on your own to prevent diseases and keep yourself healthy. What should I know about diet, weight, and exercise? Eat a healthy diet  Eat a diet that includes plenty of vegetables, fruits, low-fat dairy products, and lean protein. Do not eat a lot of foods that are high in solid fats, added sugars, or sodium. Maintain a healthy weight Body mass index (BMI) is a measurement that can be used to identify possible weight problems. It estimates body fat based on height and weight. Your health care provider can help determine your BMI and help you achieve or maintain a healthy weight. Get regular exercise Get regular exercise. This is one of the most important things you can do for your health. Most adults should: Exercise for at least 150 minutes each week. The exercise should increase your heart rate and make you sweat (moderate-intensity exercise). Do strengthening exercises at least twice a week. This is in addition to the moderate-intensity exercise. Spend less time sitting. Even light physical activity can be beneficial. Watch cholesterol and blood lipids Have your blood tested for lipids and cholesterol at 57 years of age, then have this test every 5 years. You may need to have your cholesterol levels checked more often if: Your lipid or cholesterol levels are high. You are older than 57 years of age. You are at high risk for heart disease. What should I know about cancer screening? Many types of cancers can be detected early and may often be prevented. Depending on your health history and family history, you may need to have cancer screening at various ages. This may include screening  for: Colorectal cancer. Prostate cancer. Skin cancer. Lung cancer. What should I know about heart disease, diabetes, and high blood pressure? Blood pressure and heart disease High blood pressure causes heart disease and increases the risk of stroke. This is more likely to develop in people who have high blood pressure readings or are overweight. Talk with your health care provider about your target blood pressure readings. Have your blood pressure checked: Every 3-5 years if you are 75-38 years of age. Every year if you are 65 years old or older. If you are between the ages of 81 and 16 and are a current or former smoker, ask your health care provider if you should have a one-time screening for abdominal aortic aneurysm (AAA). Diabetes Have regular diabetes screenings. This checks your fasting blood sugar level. Have the screening done: Once every three years after age 76 if you are at a normal weight and have a low risk for diabetes. More often and at a younger age if you are overweight or have a high risk for diabetes. What should I know about preventing infection? Hepatitis B If you have a higher risk for hepatitis B, you should be screened for this virus. Talk with your health care provider to find out if you are at risk for hepatitis B infection. Hepatitis C Blood testing is recommended for: Everyone born from [birth year] through 1966. Anyone with known risk factors for hepatitis C. Sexually transmitted infections (STIs) You should be screened each year for STIs, including gonorrhea and chlamydia, if: You are sexually active and are younger than  57 years of age. You are older than 57 years of age and your health care provider tells you that you are at risk for this type of infection. Your sexual activity has changed since you were last screened, and you are at increased risk for chlamydia or gonorrhea. Ask your health care provider if you are at risk. Ask your health care provider about  whether you are at high risk for HIV. Your health care provider may recommend a prescription medicine to help prevent HIV infection. If you choose to take medicine to prevent HIV, you should first get tested for HIV. You should then be tested every 3 months for as long as you are taking the medicine. Follow these instructions at home: Alcohol use Do not drink alcohol if your health care provider tells you not to drink. If you drink alcohol: Limit how much you have to 0-2 drinks a day. Know how much alcohol is in your drink. In the [place] one drink equals one 12 oz bottle of beer (355 mL), one 5 oz glass of wine (148 mL), or one 1 oz glass of hard liquor (44 mL). Lifestyle Do not use any products that contain nicotine or tobacco. These products include cigarettes, chewing tobacco, and vaping devices, such as e-cigarettes. If you need help quitting, ask your health care provider. Do not use street drugs. Do not share needles. Ask your health care provider for help if you need support or information about quitting drugs. General instructions Schedule regular health, dental, and eye exams. Stay current with your vaccines. Tell your health care provider if: You often feel depressed. You have ever been abused or do not feel safe at home. Summary Adopting a healthy lifestyle and getting preventive care are important in promoting health and wellness. Follow your health care provider's instructions about healthy diet, exercising, and getting tested or screened for diseases. Follow your health care provider's instructions on monitoring your cholesterol and blood pressure. This information is not intended to replace advice given to you by your health care provider. Make sure you discuss any questions you have with your health care provider. Document Revised: 02/28/2021 Document Reviewed: 02/28/2021 Elsevier Patient Education  [employer].

## 2022-07-07 LAB — COMPREHENSIVE METABOLIC PANEL
ALT: 20 IU/L (ref 0–44)
AST: 23 IU/L (ref 0–40)
Albumin/Globulin Ratio: 1.9 (ref 1.2–2.2)
Albumin: 4.9 g/dL (ref 3.8–4.9)
Alkaline Phosphatase: 93 IU/L (ref 44–121)
BUN/Creatinine Ratio: 13 (ref 9–20)
BUN: 12 mg/dL (ref 6–24)
Bilirubin Total: 0.5 mg/dL (ref 0.0–1.2)
CO2: 22 mmol/L (ref 20–29)
Calcium: 9.5 mg/dL (ref 8.7–10.2)
Chloride: 99 mmol/L (ref 96–106)
Creatinine, Ser: 0.93 mg/dL (ref 0.76–1.27)
Globulin, Total: 2.6 g/dL (ref 1.5–4.5)
Glucose: 92 mg/dL (ref 70–99)
Potassium: 4 mmol/L (ref 3.5–5.2)
Sodium: 138 mmol/L (ref 134–144)
Total Protein: 7.5 g/dL (ref 6.0–8.5)
eGFR: 96 mL/min/{1.73_m2} (ref >59)

## 2022-07-07 LAB — LIPID PANEL
Chol/HDL Ratio: 4.4 ratio (ref 0.0–5.0)
Cholesterol, Total: 192 mg/dL (ref 100–199)
HDL: 44 mg/dL (ref >39)
LDL Chol Calc (NIH): 87 mg/dL (ref 0–99)
Triglycerides: 376 mg/dL — ABNORMAL HIGH (ref 0–149)
VLDL Cholesterol Cal: 61 mg/dL — ABNORMAL HIGH (ref 5–40)

## 2022-07-07 LAB — CBC WITH DIFFERENTIAL/PLATELET
Basophils Absolute: 0 10*3/uL (ref 0.0–0.2)
Basos: 1 %
EOS (ABSOLUTE): 0.2 10*3/uL (ref 0.0–0.4)
Eos: 2 %
Hematocrit: 42.5 % (ref 37.5–51.0)
Hemoglobin: 14.5 g/dL (ref 13.0–17.7)
Immature Grans (Abs): 0.1 10*3/uL (ref 0.0–0.1)
Immature Granulocytes: 1 %
Lymphocytes Absolute: 1.9 10*3/uL (ref 0.7–3.1)
Lymphs: 24 %
MCH: 28.6 pg (ref 26.6–33.0)
MCHC: 34.1 g/dL (ref 31.5–35.7)
MCV: 84 fL (ref 79–97)
Monocytes Absolute: 0.5 10*3/uL (ref 0.1–0.9)
Monocytes: 6 %
Neutrophils Absolute: 5.1 10*3/uL (ref 1.4–7.0)
Neutrophils: 66 %
Platelets: 275 10*3/uL (ref 150–450)
RBC: 5.07 x10E6/uL (ref 4.14–5.80)
RDW: 12.8 % (ref 11.6–15.4)
WBC: 7.8 10*3/uL (ref 3.4–10.8)

## 2022-08-01 ENCOUNTER — Encounter: Payer: Self-pay | Admitting: Internal Medicine

## 2022-08-14 ENCOUNTER — Encounter: Payer: Self-pay | Admitting: Internal Medicine

## 2022-08-24 ENCOUNTER — Other Ambulatory Visit: Payer: Self-pay

## 2022-08-24 DIAGNOSIS — N400 Enlarged prostate without lower urinary tract symptoms: Secondary | ICD-10-CM

## 2022-08-24 MED ORDER — FINASTERIDE 5 MG PO TABS: 5.0000 mg | ORAL_TABLET | Freq: Every day | ORAL | 3 refills | Status: DC

## 2022-08-29 ENCOUNTER — Other Ambulatory Visit: Payer: Self-pay | Admitting: Family Medicine

## 2022-09-09 ENCOUNTER — Other Ambulatory Visit: Payer: Self-pay | Admitting: Family Medicine

## 2022-09-09 DIAGNOSIS — N400 Enlarged prostate without lower urinary tract symptoms: Secondary | ICD-10-CM

## 2022-10-02 ENCOUNTER — Ambulatory Visit (INDEPENDENT_AMBULATORY_CARE_PROVIDER_SITE_OTHER): Payer: PRIVATE HEALTH INSURANCE | Admitting: Family Medicine

## 2022-10-02 ENCOUNTER — Encounter: Payer: Self-pay | Admitting: Family Medicine

## 2022-10-02 VITALS — BP 110/72 | HR 84 | Temp 97.6°F | Ht 67.5 in | Wt 179.0 lb

## 2022-10-02 DIAGNOSIS — R059 Cough, unspecified: Secondary | ICD-10-CM

## 2022-10-02 DIAGNOSIS — R110 Nausea: Secondary | ICD-10-CM | POA: Diagnosis not present

## 2022-10-02 DIAGNOSIS — J069 Acute upper respiratory infection, unspecified: Secondary | ICD-10-CM | POA: Diagnosis not present

## 2022-10-02 DIAGNOSIS — J029 Acute pharyngitis, unspecified: Secondary | ICD-10-CM | POA: Diagnosis not present

## 2022-10-02 LAB — POCT INFLUENZA A/B
Influenza A, POC: NEGATIVE
Influenza B, POC: NEGATIVE

## 2022-10-02 LAB — POC COVID19 BINAXNOW: SARS Coronavirus 2 Ag: NEGATIVE

## 2022-10-02 NOTE — Patient Instructions (Signed)
Stay well hydrated. Consider avoiding dairy for 5 days and take probiiotics for at least a couple of weeks to see if this resets your gut flora and helps your bowels. BRAT diet--bananas, rice, applesauce and toast. Avoid spicy, greasy, fried foods.  Keep the diet bland (but you can advance the diet as tolerated).  Consider mucinex (guaifenesin)--an expectorant to keep the mucus thin. You can consider doing sinus rinses once or twice daily (especially if having sinus pain).  Consider repeating a COVID test in 1-2 days if respiratory symptoms persist or worsen.  Contact us if you have persistent fever, discolored mucus/phlegm, cough, shortness of breath, pain with breathing or other change or concerning symptoms.  If you have persistent nausea and loose stools, we may want to check some labs and do further evaluation.

## 2022-10-02 NOTE — Progress Notes (Signed)
Chief Complaint  Patient presents with   Nausea    Nausea and loose stools x 11 days. ST that started yesterday morning. Sinus drainage started last night. He feels fatigued, just has not energy. Had some body aches last week, HA today. Flu/covid both neg today that I did here in office. (Outside).   Nausea and loose stools x 11 days.  He denies any change in diet. Nausea comes in bouts throughout the day, not constant. Not related to eating. He is always on the "edge" of constipation in general, but denies feeling constipated now, just loose stools.  Not watery, no blood/black stools. Denies abdominal pain.  Yesterday he developed a sore throat, thinks there may be an ulcer on the back of the throat. Started having some sinus drainage last night. +fatigue. Body aches last week, not in the last few days. +HA today, intermittent. Pain is at top of head. Denies sinus pain. Possible slight ache across forehead currently. Denies cough.  No sick contacts at home  Has taken ibuprofen and tylenol a few times recently.  PMH, PSH, SH reviewed  Outpatient Encounter Medications as of 10/02/2022  Medication Sig Note   Carbinoxamine Maleate 4 MG TABS TAKE 2 TABLETS UP TO TWICE A DAY AS NEEDED FOR NASAL SYMPTOMS    finasteride (PROSCAR) 5 MG tablet TAKE 1 TABLET (5 MG TOTAL) BY MOUTH DAILY.    mirtazapine (REMERON) 30 MG tablet Take 60 mg by mouth at bedtime.    pantoprazole (PROTONIX) 40 MG tablet Take 1 tablet (40 mg total) by mouth daily.    rosuvastatin (CRESTOR) 10 MG tablet Take 1 tablet (10 mg total) by mouth daily.    traZODone (DESYREL) 50 MG tablet Take 100-150 mg by mouth at bedtime.    vortioxetine HBr (TRINTELLIX) 20 MG TABS tablet TAKE 1 TABLET BY MOUTH EVERY DAY    LORazepam (ATIVAN) 1 MG tablet Take 0.5-1 mg by mouth 2 (two) times daily. (Patient not taking: Reported on 10/02/2022) 10/02/2022: prn   [DISCONTINUED] QUEtiapine (SEROQUEL XR) 400 MG 24 hr tablet Take 400 mg by mouth at  bedtime.    No facility-administered encounter medications on file as of 10/02/2022.   No Known Allergies  ROS:  URI symptoms, nausea and diarrhea per HPI. No fever, chills. No chest pain, chest congestion. Fatigue, but no dyspnea. See HPI   PHYSICAL EXAM:  BP 110/72   Pulse 84   Temp 97.6 F (36.4 C) (Tympanic)   Ht 5' 7.5" (1.715 m)   Wt 179 lb (81.2 kg)   BMI 27.62 kg/m   Well-appearing, pleasant male, in no distress HEENT: conjunctiva and sclera are clear, EOMI. Nose--mild-mod edema, R>L, with clear mucus present. Sinuses nontender. TMs and EACs normal bilaterally Possible slight raised area at the left  soft palate--no surrounding erythema or true ulcer noted.  Rest of mucosa is normal without any swelling or erythema. Neck: no lymphadenopathy or mass Heart: regular rate and rhythm, no murmur Lungs: clear bilaterally Abdomen soft, nontender, no masses Extremities: no edema Skin: no rash Psych: normal mood, affect Neuro: alert and oriented, cranial nerves grossly intact, normal gait  Rapid COVID test was negative Influenza A&B negative   ASSESSMENT/PLAN:  Viral upper respiratory tract infection - supportive measures reviewed, and s/sx bacterial infection  Sore throat - related to PND.  Reassured no e/o ulcer, thrush, or other abnl on today's exam. f/u if persists/worsens - Plan: Influenza A/B, POC COVID-19  Cough, unspecified type - Plan: Influenza A/B, POC  COVID-19  Nausea - unclear etiology, also with diarrhea. Reviewed bland diet, probiotics.  f/u if persists/worsens - Plan: Influenza A/B, POC COVID-19  Stay well hydrated. Consider avoiding dairy for 5 days and take probiiotics for at least a couple of weeks to see if this resets your gut flora and helps your bowels. BRAT diet--bananas, rice, applesauce and toast. Avoid spicy, greasy, fried foods.  Keep the diet bland (but you can advance the diet as tolerated).  Consider mucinex (guaifenesin)--an  expectorant to keep the mucus thin. You can consider doing sinus rinses once or twice daily (especially if having sinus pain).  Contact us if you have persistent fever, discolored mucus/phlegm, cough, shortness of breath, pain with breathing or other change or concerning symptoms.  If you have persistent nausea and loose stools, we may want to check some labs and do further evaluation.  I spent 30 minutes dedicated to the care of this patient, including pre-visit review of records, face to face time, post-visit ordering of testing and documentation.

## 2022-10-10 ENCOUNTER — Ambulatory Visit
Admission: RE | Admit: 2022-10-10 | Discharge: 2022-10-10 | Disposition: A | Discharge status: Home / Self Care | Payer: PRIVATE HEALTH INSURANCE | Source: Ambulatory Visit | Attending: Family Medicine | Admitting: Family Medicine

## 2022-10-10 ENCOUNTER — Ambulatory Visit (INDEPENDENT_AMBULATORY_CARE_PROVIDER_SITE_OTHER): Payer: PRIVATE HEALTH INSURANCE | Admitting: Family Medicine

## 2022-10-10 ENCOUNTER — Encounter: Payer: Self-pay | Admitting: Family Medicine

## 2022-10-10 VITALS — BP 112/76 | HR 93 | Temp 98.5°F | Resp 22 | Wt 176.2 lb

## 2022-10-10 DIAGNOSIS — R197 Diarrhea, unspecified: Secondary | ICD-10-CM | POA: Diagnosis not present

## 2022-10-10 DIAGNOSIS — R051 Acute cough: Secondary | ICD-10-CM | POA: Diagnosis not present

## 2022-10-10 DIAGNOSIS — R509 Fever, unspecified: Secondary | ICD-10-CM | POA: Diagnosis not present

## 2022-10-10 MED ORDER — HYDROCOD POLI-CHLORPHE POLI ER 10-8 MG/5ML PO SUER: 5.0000 mL | Freq: Two times a day (BID) | ORAL | 0 refills | Status: DC | PRN

## 2022-10-10 NOTE — Progress Notes (Signed)
   Subjective:    Patient ID: Daniel Cherry, male    DOB: [birth year], 56 y.o.   MRN: [id number]  HPI He has a history of 3 weeks worth of nausea and loose stools.    He was seen on December 11 for evaluation and COVID and flu test was negative.  He was treated symptomatically at that point.  On Sunday he developed sore throat, rhinorrhea, sinus congestion with some postnasal drainage.  He did a virtual visit and was given Augmentin.  He has had that for roughly the last 48 hours however yesterday he developed fever, chills, cough with continued nausea diarrhea and headache.  Review of Systems     Objective:   Physical Exam Alert and in no distress. Tympanic membranes and canals are normal. Pharyngeal area is normal. Neck is supple without adenopathy or thyromegaly. Cardiac exam shows a regular sinus rhythm without murmurs or gallops. Lungs are clear to auscultation.        Assessment & Plan:  Acute cough - Plan: CBC with Differential/Platelet, Comprehensive metabolic panel, DG Chest 2 View, chlorpheniramine-HYDROcodone (TUSSIONEX) 10-8 MG/5ML  Diarrhea, unspecified type - Plan: CBC with Differential/Platelet, Comprehensive metabolic panel  Fever and chills - Plan: CBC with Differential/Platelet, Comprehensive metabolic panel, DG Chest 2 View He will continue on the Augmentin however I feel the need to pursue further evaluation due to continued difficulty with the diarrhea as well as the probable sinusitis/bronchitis.

## 2022-10-11 ENCOUNTER — Telehealth: Payer: Self-pay | Admitting: Family Medicine

## 2022-10-11 LAB — COMPREHENSIVE METABOLIC PANEL
ALT: 22 IU/L (ref 0–44)
AST: 26 IU/L (ref 0–40)
Albumin/Globulin Ratio: 1.5 (ref 1.2–2.2)
Albumin: 4.6 g/dL (ref 3.8–4.9)
Alkaline Phosphatase: 112 IU/L (ref 44–121)
BUN/Creatinine Ratio: 6 — ABNORMAL LOW (ref 9–20)
BUN: 6 mg/dL (ref 6–24)
Bilirubin Total: 0.4 mg/dL (ref 0.0–1.2)
CO2: 23 mmol/L (ref 20–29)
Calcium: 9.4 mg/dL (ref 8.7–10.2)
Chloride: 99 mmol/L (ref 96–106)
Creatinine, Ser: 1.08 mg/dL (ref 0.76–1.27)
Globulin, Total: 3 g/dL (ref 1.5–4.5)
Glucose: 93 mg/dL (ref 70–99)
Potassium: 3.8 mmol/L (ref 3.5–5.2)
Sodium: 138 mmol/L (ref 134–144)
Total Protein: 7.6 g/dL (ref 6.0–8.5)
eGFR: 80 mL/min/{1.73_m2} (ref >59)

## 2022-10-11 LAB — CBC WITH DIFFERENTIAL/PLATELET
Basophils Absolute: 0 10*3/uL (ref 0.0–0.2)
Basos: 0 %
EOS (ABSOLUTE): 0.1 10*3/uL (ref 0.0–0.4)
Eos: 1 %
Hematocrit: 39.3 % (ref 37.5–51.0)
Hemoglobin: 13.5 g/dL (ref 13.0–17.7)
Immature Grans (Abs): 0.1 10*3/uL (ref 0.0–0.1)
Immature Granulocytes: 1 %
Lymphocytes Absolute: 0.5 10*3/uL — ABNORMAL LOW (ref 0.7–3.1)
Lymphs: 8 %
MCH: 28.7 pg (ref 26.6–33.0)
MCHC: 34.4 g/dL (ref 31.5–35.7)
MCV: 83 fL (ref 79–97)
Monocytes Absolute: 0.8 10*3/uL (ref 0.1–0.9)
Monocytes: 11 %
Neutrophils Absolute: 5.3 10*3/uL (ref 1.4–7.0)
Neutrophils: 79 %
Platelets: 251 10*3/uL (ref 150–450)
RBC: 4.71 x10E6/uL (ref 4.14–5.80)
RDW: 12.1 % (ref 11.6–15.4)
WBC: 6.7 10*3/uL (ref 3.4–10.8)

## 2022-10-11 MED ORDER — HYDROCODONE BIT-HOMATROP MBR 5-1.5 MG/5ML PO SOLN: 5.0000 mL | Freq: Three times a day (TID) | ORAL | 0 refills | Status: DC | PRN

## 2022-10-11 NOTE — Telephone Encounter (Signed)
Patient advised.

## 2022-10-11 NOTE — Telephone Encounter (Signed)
Pt called and states that the tussionex kept him awake all night long and he was unable to get any rest, He states that in the past he was given a different cough med and it work better, hydrocodone homatropine. He is requesting that be sent into [facility] in [place]. PT can be reached at [phone].

## 2022-10-15 ENCOUNTER — Other Ambulatory Visit: Payer: Self-pay | Admitting: Family Medicine

## 2022-10-15 MED ORDER — ONDANSETRON HCL 4 MG PO TABS: 4.0000 mg | ORAL_TABLET | Freq: Three times a day (TID) | ORAL | 0 refills | Status: DC | PRN

## 2023-04-04 ENCOUNTER — Other Ambulatory Visit: Payer: Self-pay | Admitting: Family Medicine

## 2023-05-04 ENCOUNTER — Other Ambulatory Visit: Payer: Self-pay | Admitting: Family Medicine

## 2023-05-04 NOTE — Telephone Encounter (Signed)
Patient needs an appointment for further refills.

## 2023-05-08 ENCOUNTER — Other Ambulatory Visit: Payer: Self-pay | Admitting: Family Medicine

## 2023-05-16 NOTE — Progress Notes (Signed)
[address] [place] [address] Dept: [phone]  FOLLOW UP NOTE  Patient ID: Daniel Cherry, male    DOB: [birth year]  Age: 57 y.o. MRN: [id number] Date of Office Visit: 05/17/2023  Assessment  Chief Complaint: Follow-up (Concern about reflux medication: side effect reduction of bone)  HPI Daniel Cherry is a 57 year old male who presents to the clinic for follow-up visit.  He was last seen in this clinic on 04/20/2022 by Thermon Leyland, FNP, for evaluation of allergic rhinitis and reflux.  His last environmental allergy testing was on 11/20/2017 and was positive to mold and tree pollen.  At today's visit, he reports that his symptoms of allergic rhinitis have been moderately well controlled with post nasal drainage and occasional sneezing as the main symptoms. He continues carbinoxamine 8 mg twice a day and is not currently using Flonase or nasal saline rinses. His last environmental allergy testing was on 11/20/2017 and was positive to mold and tree pollen.   Reflux is reported as moderately well-controlled with the feeling of congestion in his throat as the main symptom.  He denies heartburn and vomiting.  He continues pantoprazole daily with relief of symptoms.  He is interested in changing his medicine away from a PPI due to possible future side effects.  His current medications are listed in the chart.   Drug Allergies:  No Known Allergies  Physical Exam: BP 110/60   Pulse 78   Temp 98 F (36.7 C) (Temporal)   Resp 16   Wt 178 lb 11.2 oz (81.1 kg)   SpO2 97%   BMI 27.58 kg/m    Physical Exam Vitals reviewed.  Constitutional:      Appearance: Normal appearance.  HENT:     Head: Normocephalic and atraumatic.     Right Ear: Tympanic membrane normal.     Left Ear: Tympanic membrane normal.     Nose:     Comments: Bilateral naris slightly erythematous with thin clear nasal drainage noted.  Pharynx slightly erythematous with no exudate.  Ears normal.  Eyes normal. Eyes:      Conjunctiva/sclera: Conjunctivae normal.  Cardiovascular:     Rate and Rhythm: Normal rate and regular rhythm.     Heart sounds: Normal heart sounds. No murmur heard. Pulmonary:     Effort: Pulmonary effort is normal.     Breath sounds: Normal breath sounds.     Comments: Lungs clear to auscultation Musculoskeletal:        General: Normal range of motion.     Cervical back: Normal range of motion and neck supple.  Skin:    General: Skin is warm and dry.  Neurological:     Mental Status: He is alert and oriented to person, place, and time.  Psychiatric:        Mood and Affect: Mood normal.        Behavior: Behavior normal.        Thought Content: Thought content normal.        Judgment: Judgment normal.      Assessment and Plan: 1. Perennial allergic rhinitis   2. LPRD (laryngopharyngeal reflux disease)     Meds ordered this encounter  Medications   famotidine (PEPCID) 20 MG tablet    Sig: Take 1 tablet (20 mg total) by mouth 2 (two) times daily.    Dispense:  60 tablet    Refill:  5    Patient Instructions  Allergic rhinitis Continue allergen avoidance measures directed toward mold  and tree pollen as listed below Continue carbinoxamine 4 mg tablets. Take 2 tablets up to twice a day as needed for nasal symptoms. In case you need to rotate to a different antihistamine, some examples of over the counter antihistamines include Zyrtec (cetirizine), Xyzal (levocetirizine), Allegra (fexofenadine), and Claritin (loratidine).  Continue Flonase 2 sprays in each nostril once a day as needed for stuffy nose Consider saline nasal rinses as needed for nasal symptoms. Use this before any medicated nasal sprays for best result  Reflux Continue dietary lifestyle modifications as listed below Begin famotidine 20 mg twice a day for reflux control. This will replace pantoprazole for now. Call the clinic if your reflux becomes unmanageable Recommend referral to ENT  Call the clinic if  this treatment plan is not working well for you.  Follow up in 1 year or sooner if needed.   Return in about 1 year (around 05/16/2024), or if symptoms worsen or fail to improve.    Thank you for the opportunity to care for this patient.  Please do not hesitate to contact me with questions.  Thermon Leyland, FNP [facility]

## 2023-05-16 NOTE — Patient Instructions (Incomplete)
Allergic rhinitis Continue allergen avoidance measures directed toward mold and tree pollen as listed below Continue carbinoxamine 4 mg tablets. Take 2 tablets up to twice a day as needed for nasal symptoms. In case you need to rotate to a different antihistamine, some examples of over the counter antihistamines include Zyrtec (cetirizine), Xyzal (levocetirizine), Allegra (fexofenadine), and Claritin (loratidine).  Continue Flonase 2 sprays in each nostril once a day as needed for stuffy nose Consider saline nasal rinses as needed for nasal symptoms. Use this before any medicated nasal sprays for best result  Reflux Continue dietary lifestyle modifications as listed below Continue pantoprazole 40 mg once a day.  Take this medication 30 minutes before your first meal of the day for best results  Call the clinic if this treatment plan is not working well for you.  Follow up in 1 year or sooner if needed.  Reducing Pollen Exposure The American Academy of Allergy, Asthma and Immunology suggests the following steps to reduce your exposure to pollen during allergy seasons. Do not hang sheets or clothing out to dry; pollen may collect on these items. Do not mow lawns or spend time around freshly cut grass; mowing stirs up pollen. Keep windows closed at night.  Keep car windows closed while driving. Minimize morning activities outdoors, a time when pollen counts are usually at their highest. Stay indoors as much as possible when pollen counts or humidity is high and on windy days when pollen tends to remain in the air longer. Use air conditioning when possible.  Many air conditioners have filters that trap the pollen spores. Use a HEPA room air filter to remove pollen form the indoor air you breathe.  Control of Mold Allergen Mold and fungi can grow on a variety of surfaces provided certain temperature and moisture conditions exist.  Outdoor molds grow on plants, decaying vegetation and soil.  The  major outdoor mold, Alternaria and Cladosporium, are found in very high numbers during hot and dry conditions.  Generally, a late Summer - Fall peak is seen for common outdoor fungal spores.  Rain will temporarily lower outdoor mold spore count, but counts rise rapidly when the rainy period ends.  The most important indoor molds are Aspergillus and Penicillium.  Dark, humid and poorly ventilated basements are ideal sites for mold growth.  The next most common sites of mold growth are the bathroom and the kitchen.  Outdoor [employer] Use air conditioning and keep windows closed Avoid exposure to decaying vegetation. Avoid leaf raking. Avoid grain handling. Consider wearing a face mask if working in moldy areas.  Indoor Mold Control Maintain humidity below 50%. Clean washable surfaces with 5% bleach solution. Remove sources e.g. Contaminated carpets.   Lifestyle Changes for Controlling GERD When you have GERD, stomach acid feels as if it's backing up toward your mouth. Whether or not you take medication to control your GERD, your symptoms can often be improved with lifestyle changes.   Raise Your Head Reflux is more likely to strike when you're lying down flat, because stomach fluid can flow backward more easily. Raising the head of your bed 4-6 inches can help. To do this: Slide blocks or books under the legs at the head of your bed. Or, place a wedge under the mattress. Many foam stores can make a suitable wedge for you. The wedge should run from your waist to the top of your head. Don't just prop your head on several pillows. This increases pressure on your stomach. It  can make GERD worse.  Watch Your Eating Habits Certain foods may increase the acid in your stomach or relax the lower esophageal sphincter, making GERD more likely. It's best to avoid the following: Coffee, tea, and carbonated drinks (with and without caffeine) Fatty, fried, or spicy food Mint, chocolate, onions,  and tomatoes Any other foods that seem to irritate your stomach or cause you pain  Relieve the Pressure Eat smaller meals, even if you have to eat more often. Don't lie down right after you eat. Wait a few hours for your stomach to empty. Avoid tight belts and tight-fitting clothes. Lose excess weight.  Tobacco and Alcohol Avoid smoking tobacco and drinking alcohol. They can make GERD symptoms worse.

## 2023-05-17 ENCOUNTER — Ambulatory Visit (INDEPENDENT_AMBULATORY_CARE_PROVIDER_SITE_OTHER): Payer: PRIVATE HEALTH INSURANCE | Admitting: Family Medicine

## 2023-05-17 ENCOUNTER — Encounter: Payer: Self-pay | Admitting: Family Medicine

## 2023-05-17 ENCOUNTER — Other Ambulatory Visit: Payer: Self-pay

## 2023-05-17 VITALS — BP 110/60 | HR 78 | Temp 98.0°F | Resp 16 | Wt 178.7 lb

## 2023-05-17 DIAGNOSIS — K219 Gastro-esophageal reflux disease without esophagitis: Secondary | ICD-10-CM

## 2023-05-17 DIAGNOSIS — J3089 Other allergic rhinitis: Secondary | ICD-10-CM | POA: Diagnosis not present

## 2023-05-17 MED ORDER — FAMOTIDINE 20 MG PO TABS
20.0000 mg | ORAL_TABLET | Freq: Two times a day (BID) | ORAL | 5 refills | Status: DC
Start: 2023-05-17 — End: 2024-06-10

## 2023-05-31 ENCOUNTER — Other Ambulatory Visit: Payer: Self-pay | Admitting: Family Medicine

## 2023-06-01 ENCOUNTER — Other Ambulatory Visit: Payer: Self-pay | Admitting: Family Medicine

## 2023-06-01 DIAGNOSIS — N400 Enlarged prostate without lower urinary tract symptoms: Secondary | ICD-10-CM

## 2023-06-03 ENCOUNTER — Other Ambulatory Visit: Payer: Self-pay | Admitting: Family Medicine

## 2023-07-11 DIAGNOSIS — Z23 Encounter for immunization: Secondary | ICD-10-CM | POA: Insufficient documentation

## 2023-07-12 ENCOUNTER — Ambulatory Visit: Payer: PRIVATE HEALTH INSURANCE | Admitting: Family Medicine

## 2023-07-12 ENCOUNTER — Encounter: Payer: Self-pay | Admitting: Family Medicine

## 2023-07-12 VITALS — BP 118/74 | HR 66 | Ht 68.5 in | Wt 173.6 lb

## 2023-07-12 DIAGNOSIS — Z23 Encounter for immunization: Secondary | ICD-10-CM

## 2023-07-12 DIAGNOSIS — Z0000 Encounter for general adult medical examination without abnormal findings: Secondary | ICD-10-CM | POA: Diagnosis not present

## 2023-07-12 DIAGNOSIS — J3089 Other allergic rhinitis: Secondary | ICD-10-CM | POA: Diagnosis not present

## 2023-07-12 DIAGNOSIS — K219 Gastro-esophageal reflux disease without esophagitis: Secondary | ICD-10-CM | POA: Diagnosis not present

## 2023-07-12 DIAGNOSIS — Z86010 Personal history of colonic polyps: Secondary | ICD-10-CM

## 2023-07-12 DIAGNOSIS — E782 Mixed hyperlipidemia: Secondary | ICD-10-CM

## 2023-07-12 DIAGNOSIS — G479 Sleep disorder, unspecified: Secondary | ICD-10-CM

## 2023-07-12 DIAGNOSIS — N400 Enlarged prostate without lower urinary tract symptoms: Secondary | ICD-10-CM

## 2023-07-12 DIAGNOSIS — F339 Major depressive disorder, recurrent, unspecified: Secondary | ICD-10-CM

## 2023-07-12 DIAGNOSIS — E781 Pure hyperglyceridemia: Secondary | ICD-10-CM

## 2023-07-12 MED ORDER — ROSUVASTATIN CALCIUM 10 MG PO TABS
10.0000 mg | ORAL_TABLET | Freq: Every day | ORAL | 3 refills | Status: DC
Start: 2023-07-12 — End: 2024-06-15

## 2023-07-12 MED ORDER — FINASTERIDE 5 MG PO TABS
5.0000 mg | ORAL_TABLET | Freq: Every day | ORAL | 1 refills | Status: DC
Start: 2023-07-12 — End: 2024-03-10

## 2023-07-12 MED ORDER — TRAZODONE HCL 50 MG PO TABS: 100.0000 mg | ORAL_TABLET | Freq: Every day | ORAL | 1 refills | Status: AC

## 2023-07-12 MED ORDER — PANTOPRAZOLE SODIUM 40 MG PO TBEC
40.0000 mg | DELAYED_RELEASE_TABLET | Freq: Every day | ORAL | 1 refills | Status: DC
Start: 2023-07-12 — End: 2024-03-10

## 2023-07-12 NOTE — Progress Notes (Signed)
Complete physical exam  Patient: Daniel Cherry   DOB: 12-13-1965   57 y.o. Male  MRN: [id number]  Subjective:    Chief Complaint  Patient presents with   Immunizations    Covid and flu shot    Daniel Cherry is a 57 y.o. male who presents today for a complete physical exam. He reports consuming a general diet. Gym/ health club routine includes mod to heavy weightlifting and walking on track . He generally feels well. He reports sleeping well.  He does use to as a dental regularly.  There is a family history of sleep disturbance.  He does not have additional problems to discuss today.  His allergies are under good control.  He continues on Crestor for his lipids.  He also has reflux disease and does use Protonix.  He did have a colonoscopy which did show 3 adenomatous polyps in 2023.  Presently he is not on any psychotropic medications.  He does have some urinary hesitancy but at this time is comfortable with how he is handling this.  His work and home life are fine.   Most recent fall risk assessment:    07/12/2023    2:46 PM  Fall Risk   Falls in the past year? 0  Number falls in past yr: 0  Injury with Fall? 0  Follow up Falls evaluation completed     Most recent depression screenings:    07/12/2023    2:46 PM 11/05/2020    9:22 AM  PHQ 2/9 Scores  PHQ - 2 Score 0 0    Vision:Within last year and Dental: Current dental problems and Last dental visit: May 2024    Patient Care Team: Ronnald Nian, MD as PCP - General (Family Medicine)   Outpatient Medications Prior to Visit  Medication Sig   Carbinoxamine Maleate 4 MG TABS TAKE 2 TABLETS UP TO TWICE A DAY AS NEEDED FOR NASAL SYMPTOMS   mirtazapine (REMERON) 30 MG tablet Take 60 mg by mouth at bedtime.   vortioxetine HBr (TRINTELLIX) 20 MG TABS tablet TAKE 1 TABLET BY MOUTH EVERY DAY   amoxicillin-clavulanate (AUGMENTIN) 875-125 MG tablet Take 1 tablet by mouth 2 (two) times daily. (Patient not taking: Reported on  07/12/2023)   famotidine (PEPCID) 20 MG tablet Take 1 tablet (20 mg total) by mouth 2 (two) times daily. (Patient not taking: Reported on 07/12/2023)   HYDROcodone bit-homatropine (HYCODAN) 5-1.5 MG/5ML syrup Take 5 mLs by mouth every 8 (eight) hours as needed for cough. (Patient not taking: Reported on 07/12/2023)   LORazepam (ATIVAN) 1 MG tablet Take 0.5-1 mg by mouth 2 (two) times daily. (Patient not taking: Reported on 07/12/2023)   ondansetron (ZOFRAN) 4 MG tablet Take 1 tablet (4 mg total) by mouth every 8 (eight) hours as needed for nausea or vomiting. (Patient not taking: Reported on 07/12/2023)   [DISCONTINUED] finasteride (PROSCAR) 5 MG tablet TAKE 1 TABLET (5 MG TOTAL) BY MOUTH DAILY.   [DISCONTINUED] pantoprazole (PROTONIX) 40 MG tablet TAKE 1 TABLET BY MOUTH EVERY DAY   [DISCONTINUED] rosuvastatin (CRESTOR) 10 MG tablet Take 1 tablet (10 mg total) by mouth daily.   [DISCONTINUED] traZODone (DESYREL) 50 MG tablet Take 100-150 mg by mouth at bedtime.   No facility-administered medications prior to visit.    Review of Systems  All other systems reviewed and are negative.         Objective:     BP 118/74   Pulse 66   Ht  5' 8.5" (1.74 m)   Wt 173 lb 9.6 oz (78.7 kg)   SpO2 94%   BMI 26.01 kg/m    Physical Exam  Alert and in no distress. Tympanic membranes and canals are normal. Pharyngeal area is normal. Neck is supple without adenopathy or thyromegaly. Cardiac exam shows a regular sinus rhythm without murmurs or gallops. Lungs are clear to auscultation.      Assessment & Plan:    Routine Health Maintenance and Physical Exam  Immunization History  Administered Date(s) Administered   Influenza,inj,Quad PF,6+ Mos 10/12/2015, 09/28/2016, 09/04/2017, 08/31/2018, 07/06/2022   Influenza-Unspecified 09/07/2021   PFIZER(Purple Top)SARS-COV-2 Vaccination 01/13/2020, 02/03/2020, 09/04/2020   Tdap 07/11/2012, 07/06/2022   Zoster Recombinant(Shingrix) 01/13/2021, 04/20/2021     Health Maintenance  Topic Date Due   INFLUENZA VACCINE  05/24/2023   COVID-19 Vaccine (4 - 2023-24 season) 06/24/2023   Colonoscopy  06/22/2027   DTaP/Tdap/Td (3 - Td or Tdap) 07/06/2032   Hepatitis C Screening  Completed   HIV Screening  Completed   Zoster Vaccines- Shingrix  Completed   HPV VACCINES  Aged Out    Discussed health benefits of physical activity, and encouraged him to engage in regular exercise appropriate for his age and condition.  Problem List Items Addressed This Visit     BPH (benign prostatic hyperplasia)   Relevant Medications   finasteride (PROSCAR) 5 MG tablet   Depression, recurrent (HCC)   Relevant Medications   traZODone (DESYREL) 50 MG tablet   Hx of adenomatous colonic polyps   Hypertriglyceridemia   Relevant Medications   rosuvastatin (CRESTOR) 10 MG tablet   LPRD (laryngopharyngeal reflux disease)   Relevant Medications   pantoprazole (PROTONIX) 40 MG tablet   Mixed hyperlipidemia   Relevant Medications   rosuvastatin (CRESTOR) 10 MG tablet   Need for influenza vaccination   Relevant Orders   Flu vaccine trivalent PF, 6mos and older(Flulaval,Afluria,Fluarix,Fluzone)   Perennial allergic rhinitis   Sleep disturbance   Relevant Medications   traZODone (DESYREL) 50 MG tablet   Other Visit Diagnoses     Routine general medical examination at a health care facility    -  Primary   Relevant Orders   CBC with Differential/Platelet   Comprehensive metabolic panel   Lipid panel   Need for COVID-19 vaccine       Relevant Orders   Pfizer Comirnaty Covid -19 Vaccine 62yrs and older     Recommend that you take Protonix on an as-needed basis to see if that will help control his symptoms.  Continue on all of his other medications.  Encouraged him to continue to take good care of himself. Follow-up 1 year    Sharlot Gowda, MD

## 2023-07-13 LAB — CBC WITH DIFFERENTIAL/PLATELET
Basophils Absolute: 0 10*3/uL (ref 0.0–0.2)
Basos: 1 %
EOS (ABSOLUTE): 0.2 10*3/uL (ref 0.0–0.4)
Eos: 2 %
Hematocrit: 41.7 % (ref 37.5–51.0)
Hemoglobin: 13.9 g/dL (ref 13.0–17.7)
Immature Grans (Abs): 0 10*3/uL (ref 0.0–0.1)
Immature Granulocytes: 0 %
Lymphocytes Absolute: 1.9 10*3/uL (ref 0.7–3.1)
Lymphs: 29 %
MCH: 28.7 pg (ref 26.6–33.0)
MCHC: 33.3 g/dL (ref 31.5–35.7)
MCV: 86 fL (ref 79–97)
Monocytes Absolute: 0.4 10*3/uL (ref 0.1–0.9)
Monocytes: 7 %
Neutrophils Absolute: 3.9 10*3/uL (ref 1.4–7.0)
Neutrophils: 61 %
Platelets: 221 10*3/uL (ref 150–450)
RBC: 4.84 x10E6/uL (ref 4.14–5.80)
RDW: 12.6 % (ref 11.6–15.4)
WBC: 6.4 10*3/uL (ref 3.4–10.8)

## 2023-07-13 LAB — COMPREHENSIVE METABOLIC PANEL
ALT: 15 IU/L (ref 0–44)
AST: 18 IU/L (ref 0–40)
Albumin: 4.8 g/dL (ref 3.8–4.9)
Alkaline Phosphatase: 77 IU/L (ref 44–121)
BUN/Creatinine Ratio: 11 (ref 9–20)
BUN: 10 mg/dL (ref 6–24)
Bilirubin Total: 0.5 mg/dL (ref 0.0–1.2)
CO2: 23 mmol/L (ref 20–29)
Calcium: 9.2 mg/dL (ref 8.7–10.2)
Chloride: 105 mmol/L (ref 96–106)
Creatinine, Ser: 0.95 mg/dL (ref 0.76–1.27)
Globulin, Total: 2.2 g/dL (ref 1.5–4.5)
Glucose: 92 mg/dL (ref 70–99)
Potassium: 3.9 mmol/L (ref 3.5–5.2)
Sodium: 143 mmol/L (ref 134–144)
Total Protein: 7 g/dL (ref 6.0–8.5)
eGFR: 93 mL/min/{1.73_m2} (ref >59)

## 2023-07-13 LAB — LIPID PANEL
Chol/HDL Ratio: 3.3 ratio (ref 0.0–5.0)
Cholesterol, Total: 164 mg/dL (ref 100–199)
HDL: 49 mg/dL (ref >39)
LDL Chol Calc (NIH): 86 mg/dL (ref 0–99)
Triglycerides: 169 mg/dL — ABNORMAL HIGH (ref 0–149)
VLDL Cholesterol Cal: 29 mg/dL (ref 5–40)

## 2023-12-06 ENCOUNTER — Other Ambulatory Visit: Payer: Self-pay

## 2023-12-06 MED ORDER — CARBINOXAMINE MALEATE 4 MG PO TABS: ORAL_TABLET | ORAL | 0 refills | Status: DC

## 2023-12-18 ENCOUNTER — Encounter: Payer: Self-pay | Admitting: Internal Medicine

## 2024-03-09 ENCOUNTER — Other Ambulatory Visit: Payer: Self-pay | Admitting: Family Medicine

## 2024-03-10 ENCOUNTER — Other Ambulatory Visit: Payer: Self-pay | Admitting: Family Medicine

## 2024-03-10 DIAGNOSIS — K219 Gastro-esophageal reflux disease without esophagitis: Secondary | ICD-10-CM

## 2024-03-10 DIAGNOSIS — N400 Enlarged prostate without lower urinary tract symptoms: Secondary | ICD-10-CM

## 2024-05-13 DIAGNOSIS — C4491 Basal cell carcinoma of skin, unspecified: Secondary | ICD-10-CM | POA: Insufficient documentation

## 2024-06-05 ENCOUNTER — Other Ambulatory Visit: Payer: Self-pay | Admitting: Family Medicine

## 2024-06-10 ENCOUNTER — Encounter: Payer: Self-pay | Admitting: Allergy & Immunology

## 2024-06-10 ENCOUNTER — Ambulatory Visit (INDEPENDENT_AMBULATORY_CARE_PROVIDER_SITE_OTHER): Payer: PRIVATE HEALTH INSURANCE | Admitting: Allergy & Immunology

## 2024-06-10 VITALS — BP 108/72 | HR 67 | Temp 98.4°F | Ht 68.0 in | Wt 175.5 lb

## 2024-06-10 DIAGNOSIS — J3089 Other allergic rhinitis: Secondary | ICD-10-CM | POA: Diagnosis not present

## 2024-06-10 DIAGNOSIS — K219 Gastro-esophageal reflux disease without esophagitis: Secondary | ICD-10-CM

## 2024-06-10 DIAGNOSIS — F339 Major depressive disorder, recurrent, unspecified: Secondary | ICD-10-CM | POA: Diagnosis not present

## 2024-06-10 DIAGNOSIS — C44319 Basal cell carcinoma of skin of other parts of face: Secondary | ICD-10-CM

## 2024-06-10 MED ORDER — CARBINOXAMINE MALEATE 4 MG PO TABS: ORAL_TABLET | ORAL | 3 refills | Status: AC

## 2024-06-10 MED ORDER — PANTOPRAZOLE SODIUM 40 MG PO TBEC: 40.0000 mg | DELAYED_RELEASE_TABLET | Freq: Every day | ORAL | 3 refills | Status: AC

## 2024-06-10 NOTE — Progress Notes (Signed)
 FOLLOW UP  Date of Service/Encounter:  06/10/24   Assessment:   Perennial and seasonal allergic rhinitis (mold, trees)   LPRD (laryngopharyngeal reflux disease)   Complicated past medical history including depression anxiety as well as a recent basal cell carcinoma  Plan/Recommendations:   Allergic rhinitis (mold, trees) - Continue carbinoxamine  4 mg tablets 1-2 times daily. - Continue Flonase  2 sprays in each nostril once a day as needed for stuffy nose  2. Reflux - Continue with the pantoprazole  40mg  daily.  3. Return in about 1 year (around 06/10/2025). You can have the follow up appointment with Dr. Iva or a Nurse Practicioner (our Nurse Practitioners are excellent and always have Physician oversight!).    Subjective:   Daniel Cherry is a 58 y.o. male presenting today for follow up of  Chief Complaint  Patient presents with   Allergic Rhinitis     No issues or concerns today, just follow up visit.    Daniel Cherry has a history of the following: Patient Active Problem List   Diagnosis Date Noted   Basal cell carcinoma 05/13/2024   Sleep disturbance 07/12/2023   Need for influenza vaccination 07/11/2023   Hx of adenomatous colonic polyps 06/30/2021   Depression, recurrent (HCC) 05/14/2020   LPRD (laryngopharyngeal reflux disease) 05/14/2020   Perennial allergic rhinitis 06/05/2018   Mixed hyperlipidemia 08/11/2014   Hypertriglyceridemia 08/11/2014   Family history of heart disease in male family member before age 42 03/30/2011   BPH (benign prostatic hyperplasia) 03/30/2011    History obtained from: chart review and patient.  Discussed the use of AI scribe software for clinical note transcription with the patient and/or guardian, who gave verbal consent to proceed.  [name] is a 58 y.o. male presenting for a follow up visit.  He was last seen in July 2024.  At that time, he was continued on carbinoxamine  4 mg 2 tablets up to twice a day as well as  Flonase .  For his reflux, he was started on famotidine  20 mg twice daily to replace pantoprazole .  Since last visit, he has done very well.  His allergies have been well-controlled since his last visit. He is currently taking carboxylamine twice a day, which he finds effective and affordable, costing less than ten dollars. He identifies fall and spring as the worst times of the year for his environmental allergies.  He has a history of acid reflux and was previously on famotidine , which worsened his symptoms. He has since switched back to pantoprazole , which he finds effective.  He has been on Trintellix for many years for anxiety, describing it as a 'total Secretary/administrator'. Additionally, he takes Remeron  for anxiety and trazodone  for sleep.  He recently had a sinus infection for which he was prescribed Augmentin, but he notes that sinus infections are not a routine issue for him. This is a less than once per year episode.   He works in Set designer, primarily conducting record reviews online for [employer], and has not been involved in direct service for a few years. 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     Otherwise, there have been no changes to his past medical history, surgical history, family history, or social history.    Review of systems otherwise negative other than that mentioned in the HPI.    Objective:   Blood pressure 108/72, pulse 67, temperature 98.4 F (36.9 C), height 5' 8 (1.727 m), weight 175 lb  8 oz (79.6 kg), SpO2 95%. Body mass index is 26.68 kg/m.    Physical Exam Vitals reviewed.  Constitutional:      Appearance: He is well-developed.     Comments: Very pleasant male.  HENT:     Head: Normocephalic and atraumatic.     Right Ear: Tympanic membrane, ear canal and external ear normal.     Left Ear: Tympanic membrane, ear canal and external ear normal.     Nose: No nasal deformity, septal deviation,  mucosal edema or rhinorrhea.     Right Turbinates: Enlarged and swollen.     Left Turbinates: Enlarged and swollen.     Right Sinus: No maxillary sinus tenderness or frontal sinus tenderness.     Left Sinus: No maxillary sinus tenderness or frontal sinus tenderness.     Mouth/Throat:     Mouth: Mucous membranes are not pale and not dry.     Pharynx: Uvula midline.     Comments: Mild to moderate cobblestoning. Eyes:     General:        Right eye: No discharge.        Left eye: No discharge.     Conjunctiva/sclera: Conjunctivae normal.     Right eye: Right conjunctiva is not injected. No chemosis.    Left eye: Left conjunctiva is not injected. No chemosis.    Pupils: Pupils are equal, round, and reactive to light.  Cardiovascular:     Rate and Rhythm: Normal rate and regular rhythm.     Heart sounds: Normal heart sounds.  Pulmonary:     Effort: Pulmonary effort is normal. No tachypnea, accessory muscle usage or respiratory distress.     Breath sounds: Normal breath sounds. No wheezing, rhonchi or rales.     Comments: Moving air well in all lung fields. Chest:     Chest wall: No tenderness.  Lymphadenopathy:     Cervical: No cervical adenopathy.  Skin:    Coloration: Skin is not pale.     Findings: No abrasion, erythema, petechiae or rash. Rash is not papular, urticarial or vesicular.  Neurological:     Mental Status: He is alert.  Psychiatric:        Behavior: Behavior is cooperative.      Diagnostic studies: none       Marty Shaggy, MD  [facility] 

## 2024-06-10 NOTE — Patient Instructions (Addendum)
 Allergic rhinitis (mold, trees) - Continue carbinoxamine  4 mg tablets 1-2 times daily. - Continue Flonase  2 sprays in each nostril once a day as needed for stuffy nose  2. Reflux - Continue with the pantoprazole  40mg  daily.  3. Return in about 1 year (around 06/10/2025). You can have the follow up appointment with Dr. Iva or a Nurse Practicioner (our Nurse Practitioners are excellent and always have Physician oversight!).    Please inform us  of any Emergency Department visits, hospitalizations, or changes in symptoms. Call us  before going to the ED for breathing or allergy symptoms since we might be able to fit you in for a sick visit. Feel free to contact us  anytime with any questions, problems, or concerns.  It was a pleasure to see you again today!  Websites that have reliable patient information: 1. American Academy of Asthma, Allergy, and Immunology: www.aaaai.org 2. Food Allergy Research and Education (FARE): foodallergy.org 3. Mothers of Asthmatics: http://www.asthmacommunitynetwork.org 4. American College of Allergy, Asthma, and Immunology: www.acaai.org      "Like" us  on Facebook and Instagram for our latest updates!      A healthy democracy works best when [employer] participate! Make sure you are registered to vote! If you have moved or changed any of your contact information, you will need to get this updated before voting! Scan the QR codes below to learn more!

## 2024-06-17 ENCOUNTER — Ambulatory Visit: Payer: PRIVATE HEALTH INSURANCE | Admitting: Family Medicine

## 2024-06-17 VITALS — BP 102/70 | HR 64 | Ht 68.0 in | Wt 172.0 lb

## 2024-06-17 DIAGNOSIS — N400 Enlarged prostate without lower urinary tract symptoms: Secondary | ICD-10-CM

## 2024-06-17 DIAGNOSIS — E782 Mixed hyperlipidemia: Secondary | ICD-10-CM

## 2024-06-17 DIAGNOSIS — G479 Sleep disorder, unspecified: Secondary | ICD-10-CM

## 2024-06-17 DIAGNOSIS — Z860101 Personal history of adenomatous and serrated colon polyps: Secondary | ICD-10-CM

## 2024-06-17 DIAGNOSIS — F339 Major depressive disorder, recurrent, unspecified: Secondary | ICD-10-CM

## 2024-06-17 DIAGNOSIS — Z8249 Family history of ischemic heart disease and other diseases of the circulatory system: Secondary | ICD-10-CM

## 2024-06-17 DIAGNOSIS — E781 Pure hyperglyceridemia: Secondary | ICD-10-CM

## 2024-06-17 DIAGNOSIS — J3089 Other allergic rhinitis: Secondary | ICD-10-CM

## 2024-06-17 DIAGNOSIS — N5201 Erectile dysfunction due to arterial insufficiency: Secondary | ICD-10-CM

## 2024-06-17 DIAGNOSIS — K219 Gastro-esophageal reflux disease without esophagitis: Secondary | ICD-10-CM

## 2024-06-17 DIAGNOSIS — Z23 Encounter for immunization: Secondary | ICD-10-CM | POA: Diagnosis not present

## 2024-06-17 MED ORDER — TADALAFIL 20 MG PO TABS: 20.0000 mg | ORAL_TABLET | Freq: Every day | ORAL | 5 refills | Status: AC | PRN

## 2024-06-17 MED ORDER — ROSUVASTATIN CALCIUM 10 MG PO TABS: 10.0000 mg | ORAL_TABLET | Freq: Every day | ORAL | 3 refills | Status: AC

## 2024-06-17 MED ORDER — FINASTERIDE 5 MG PO TABS: 5.0000 mg | ORAL_TABLET | Freq: Every day | ORAL | 3 refills | Status: AC

## 2024-06-17 NOTE — Progress Notes (Signed)
   Subjective:    Patient ID: Daniel Cherry, male    DOB: 1966/09/12, 58 y.o.   MRN: [id number]  Discussed the use of AI scribe software for clinical note transcription with the patient, who gave verbal consent to proceed.  History of Present Illness   Daniel Cherry is a 58 year old male who presents with concerns about erectile dysfunction.  He describes his erectile dysfunction as related to anxiety and nervousness during sexual activity rather than a physical issue. He has not engaged in sexual activity for approximately seven to eight years and experiences panic and nervousness, which he believes affects his ability to maintain an erection. He has previously used Viagra, which was effective but caused side effects such as headaches and facial flushing. He is interested in resuming sexual activity and is seeking medication to help manage his erectile dysfunction. He is currently considering counseling again, as his previous counselor retired.  He reports an episode of vomiting and diarrhea that occurred last night, which he attributes to something he ate. He notes that he is feeling better today and does not currently require medication for nausea.  His current medications include finasteride  for prostate health and hair growth, Crestor  for cholesterol management, trazodone  for sleep, lorazepam, Remeron , and Trintellix, which are managed by his psychiatrist. He also takes Protonix  for acid reflux and has a history of colonic polyps, with colonoscopies scheduled every five years.  He is in a long-term relationship for twenty years, not married but has completed legal arrangements. No family history of prostate cancer. No current nausea, but recent vomiting and diarrhea.           Review of Systems     Objective:    Physical Exam Physical Exam     Alert and in no distress. Tympanic membranes and canals are normal. Pharyngeal area is normal. Neck is supple without adenopathy or  thyromegaly. Cardiac exam shows a regular sinus rhythm without murmurs or gallops. Lungs are clear to auscultation. Abdominal exam shows no hepatosplenomegaly with normal bowel sounds.           Assessment & Plan:  Assessment and Plan    Erectile dysfunction, psychogenic Erectile dysfunction is psychogenic, linked to anxiety during sexual activity. Previous Viagra use effective but caused headaches and flushing. Interested in pharmacological aid for psychological barriers. - Prescribe Cialis  20 mg 30 minutes to 1 hour before sexual activity. - Discuss potential side effects of Cialis , including nasal congestion and headaches. - Encourage counseling for psychological issues. - Advise use of GoodRx for cost-effective Cialis  purchase.  Benign prostatic hyperplasia Managed with finasteride  for prostate health and hair growth. No significant side effects reported.  Hyperlipidemia Managed with low dose Crestor .  General Health Maintenance Routine health maintenance discussed, including immunizations and screenings. - Administer pneumonia vaccine for individuals over 50. - Recommend flu and COVID vaccinations in September. - Order basic blood work to update health records.       Over 40 minutes spent discussing all these issues with him.

## 2024-06-18 ENCOUNTER — Ambulatory Visit: Payer: Self-pay | Admitting: Family Medicine

## 2024-06-18 LAB — CBC WITH DIFFERENTIAL/PLATELET
Basophils Absolute: 0.1 x10E3/uL (ref 0.0–0.2)
Basos: 1 %
EOS (ABSOLUTE): 0.1 x10E3/uL (ref 0.0–0.4)
Eos: 2 %
Hematocrit: 43.8 % (ref 37.5–51.0)
Hemoglobin: 14.7 g/dL (ref 13.0–17.7)
Immature Grans (Abs): 0.1 x10E3/uL (ref 0.0–0.1)
Immature Granulocytes: 1 %
Lymphocytes Absolute: 1.4 x10E3/uL (ref 0.7–3.1)
Lymphs: 17 %
MCH: 29.1 pg (ref 26.6–33.0)
MCHC: 33.6 g/dL (ref 31.5–35.7)
MCV: 87 fL (ref 79–97)
Monocytes Absolute: 0.7 x10E3/uL (ref 0.1–0.9)
Monocytes: 8 %
Neutrophils Absolute: 6.2 x10E3/uL (ref 1.4–7.0)
Neutrophils: 71 %
Platelets: 238 x10E3/uL (ref 150–450)
RBC: 5.05 x10E6/uL (ref 4.14–5.80)
RDW: 12.9 % (ref 11.6–15.4)
WBC: 8.6 x10E3/uL (ref 3.4–10.8)

## 2024-06-18 LAB — COMPREHENSIVE METABOLIC PANEL WITH GFR
ALT: 27 IU/L (ref 0–44)
AST: 25 IU/L (ref 0–40)
Albumin: 4.8 g/dL (ref 3.8–4.9)
Alkaline Phosphatase: 80 IU/L (ref 44–121)
BUN/Creatinine Ratio: 10 (ref 9–20)
BUN: 9 mg/dL (ref 6–24)
Bilirubin Total: 0.4 mg/dL (ref 0.0–1.2)
CO2: 23 mmol/L (ref 20–29)
Calcium: 9.9 mg/dL (ref 8.7–10.2)
Chloride: 101 mmol/L (ref 96–106)
Creatinine, Ser: 0.87 mg/dL (ref 0.76–1.27)
Globulin, Total: 2.3 g/dL (ref 1.5–4.5)
Glucose: 80 mg/dL (ref 70–99)
Potassium: 4.2 mmol/L (ref 3.5–5.2)
Sodium: 140 mmol/L (ref 134–144)
Total Protein: 7.1 g/dL (ref 6.0–8.5)
eGFR: 99 mL/min/1.73 (ref >59)

## 2024-06-18 LAB — LIPID PANEL
Chol/HDL Ratio: 3.5 ratio (ref 0.0–5.0)
Cholesterol, Total: 162 mg/dL (ref 100–199)
HDL: 46 mg/dL (ref >39)
LDL Chol Calc (NIH): 75 mg/dL (ref 0–99)
Triglycerides: 252 mg/dL — ABNORMAL HIGH (ref 0–149)
VLDL Cholesterol Cal: 41 mg/dL — ABNORMAL HIGH (ref 5–40)

## 2024-06-18 LAB — PSA: Prostate Specific Ag, Serum: 0.7 ng/mL (ref 0.0–4.0)

## 2024-08-20 ENCOUNTER — Encounter: Payer: PRIVATE HEALTH INSURANCE | Admitting: Family Medicine

## 2024-10-20 ENCOUNTER — Ambulatory Visit
Admission: EM | Admit: 2024-10-20 | Discharge: 2024-10-20 | Disposition: A | Discharge status: Home / Self Care | Payer: PRIVATE HEALTH INSURANCE | Attending: Family Medicine | Admitting: Family Medicine

## 2024-10-20 ENCOUNTER — Ambulatory Visit: Payer: Self-pay

## 2024-10-20 DIAGNOSIS — B349 Viral infection, unspecified: Secondary | ICD-10-CM | POA: Diagnosis not present

## 2024-10-20 LAB — POCT INFLUENZA A/B
Influenza A, POC: NEGATIVE
Influenza B, POC: NEGATIVE

## 2024-10-20 MED ORDER — LOPERAMIDE HCL 2 MG PO CAPS: 2.0000 mg | ORAL_CAPSULE | Freq: Two times a day (BID) | ORAL | 0 refills | Status: AC | PRN

## 2024-10-20 MED ORDER — ONDANSETRON 4 MG PO TBDP: 4.0000 mg | ORAL_TABLET | Freq: Three times a day (TID) | ORAL | 0 refills | Status: AC | PRN

## 2024-10-20 MED ORDER — PROMETHAZINE-DM 6.25-15 MG/5ML PO SYRP: 5.0000 mL | ORAL_SOLUTION | Freq: Three times a day (TID) | ORAL | 0 refills | Status: AC | PRN

## 2024-10-20 NOTE — Telephone Encounter (Signed)
 Per chart review, pt is in urgent care for eval/treat at this time.

## 2024-10-20 NOTE — ED Triage Notes (Signed)
 Pt started having nausea, diarrhea and vomiting today; cough, sinus drainage, nasal congestion x 2 days. OTC cold meds gives some relief.

## 2024-10-20 NOTE — Discharge Instructions (Signed)
 We will manage this as a viral illness, viral syndrome. For sore throat or cough try using a honey-based tea. Use 3 teaspoons of honey with juice squeezed from half lemon. Place shaved pieces of ginger into 1/2-1 cup of water and warm over stove top. Then mix the ingredients and repeat every 4 hours as needed. Please take ibuprofen 600mg  every 6 hours with food alternating with OR taken together with Tylenol 500mg -650mg  every 6 hours for throat pain, fevers, aches and pains. Hydrate very well with at least 2 liters of water. Eat light meals such as soups (chicken and noodles, vegetable, chicken and wild rice).  Do not eat foods that you are allergic to.  Taking an antihistamine like Zyrtec (10mg  daily) can help against postnasal drainage, sinus congestion which can cause sinus pain, sinus headaches, throat pain, painful swallowing, coughing.  You can take this together with ondansetron , loperamide for nausea, vomiting, diarrhea.

## 2024-10-20 NOTE — ED Provider Notes (Signed)
 " [facility]  Note:  This document was prepared using Conservation officer, historic buildings and may include unintentional dictation errors.  MRN: [id number] DOB: May 16, 1966  Subjective:   Daniel Cherry is a 58 y.o. male presenting for 2 day history of sinus drainage, subjective fever, coughing, started having vomiting, diarrhea today. No headache, chest pain, shob, wheezing. No fever, hematemesis, bloody stools, recent antibiotic use, hospitalizations or long distance travel.  Has not eaten raw foods, drank unfiltered water.  No history of GI disorders including Crohn's, IBS, ulcerative colitis. No asthma. No smoking of any kind including cigarettes, cigars, vaping, marijuana use.    Current Outpatient Medications  Medication Instructions   Carbinoxamine  Maleate 4 MG TABS Take 2 tablets up to twice a day as needed for nasal symptoms   finasteride  (PROSCAR ) 5 mg, Oral, Daily   LORazepam (ATIVAN) 0.5-1 mg, 2 times daily   mirtazapine  (REMERON ) 60 mg, Daily at bedtime   pantoprazole  (PROTONIX ) 40 mg, Oral, Daily   rosuvastatin  (CRESTOR ) 10 mg, Oral, Daily   tadalafil  (CIALIS ) 20 mg, Oral, Daily PRN   traZODone  (DESYREL ) 100-150 mg, Oral, Daily at bedtime   vortioxetine HBr (TRINTELLIX) 20 MG TABS tablet TAKE 1 TABLET BY MOUTH EVERY DAY    Allergies[1]  Past Medical History:  Diagnosis Date   Allergy    Anxiety    Depression    Hyperlipidemia      Past Surgical History:  Procedure Laterality Date   COLONOSCOPY     DERMABRASION  1986   SKIN BIOPSY Left 08/09/2021   dysplastic compound neus with severe atypia   SKIN BIOPSY     severe dysplastic nevus    Family History  Problem Relation Age of Onset   Hyperlipidemia Mother    Dementia Mother 59   Colon polyps Father    Heart disease Father    Hypertension Father    Hyperlipidemia Father    Heart disease Sister    Hyperlipidemia Sister    Heart disease Brother    Hyperlipidemia Brother    Cancer -  Prostate Maternal Uncle    Cancer - Prostate Paternal Uncle    Colon cancer Neg Hx    Esophageal cancer Neg Hx    Stomach cancer Neg Hx    Rectal cancer Neg Hx    Allergic rhinitis Neg Hx    Angioedema Neg Hx    Asthma Neg Hx    Atopy Neg Hx    Eczema Neg Hx    Immunodeficiency Neg Hx    Urticaria Neg Hx     Social History   Occupational History    Comment: SW, [employer]  Tobacco Use   Smoking status: Never   Smokeless tobacco: Never  Vaping Use   Vaping status: Never Used  Substance and Sexual Activity   Alcohol use: Yes    Alcohol/week: 1.0 standard drink of alcohol    Types: 1 Glasses of wine per week    Comment: 2-3 monthly   Drug use: No   Sexual activity: Yes     ROS   Objective:   Vitals: BP 110/71 (BP Location: Right Arm)   Pulse (!) 105   Temp 100 F (37.8 C) (Oral)   Resp 16   SpO2 93%   Physical Exam Constitutional:      General: He is not in acute distress.    Appearance: Normal appearance. He is well-developed and normal weight. He is not ill-appearing, toxic-appearing or diaphoretic.  HENT:     Head: Normocephalic and atraumatic.     Right Ear: External ear normal.     Left Ear: External ear normal.     Nose: Nose normal.     Mouth/Throat:     Mouth: Mucous membranes are moist.  Eyes:     General: No scleral icterus.       Right eye: No discharge.        Left eye: No discharge.     Extraocular Movements: Extraocular movements intact.     Conjunctiva/sclera: Conjunctivae normal.     Pupils: Pupils are equal, round, and reactive to light.  Cardiovascular:     Rate and Rhythm: Normal rate and regular rhythm.     Heart sounds: Normal heart sounds. No murmur heard.    No friction rub. No gallop.  Pulmonary:     Effort: Pulmonary effort is normal. No respiratory distress.     Breath sounds: Normal breath sounds. No stridor. No wheezing, rhonchi or rales.  Abdominal:     General: Bowel sounds are increased. There is no distension.      Palpations: Abdomen is soft. There is no mass.     Tenderness: There is no abdominal tenderness. There is no right CVA tenderness, left CVA tenderness, guarding or rebound.  Skin:    General: Skin is warm and dry.  Neurological:     Mental Status: He is alert and oriented to person, place, and time.  Psychiatric:        Mood and Affect: Mood normal.        Behavior: Behavior normal.        Thought Content: Thought content normal.    Results for orders placed or performed during the hospital encounter of 10/20/24 (from the past 24 hours)  POCT Influenza A/B     Status: Normal   Collection Time: 10/20/24 10:27 AM  Result Value Ref Range   Influenza A, POC Negative Negative   Influenza B, POC Negative Negative    Assessment and Plan :   PDMP not reviewed this encounter.  1. Acute viral syndrome      Deferred imaging given clear cardiopulmonary exam, hemodynamically stable vital signs. Patient declined COVID testing and I'm in agreement given low risk factors. Suspect viral syndrome. Physical exam findings reassuring and vital signs stable for discharge. Advised supportive care, offered symptomatic relief. Counseled patient on potential for adverse effects with medications prescribed/recommended today, ER and return-to-clinic precautions discussed, patient verbalized understanding.      [1] No Known Allergies    Christopher Savannah, PA-C 10/20/24 1028  "

## 2025-06-09 ENCOUNTER — Ambulatory Visit: Payer: PRIVATE HEALTH INSURANCE | Admitting: Allergy & Immunology
